# Patient Record
Sex: Female | Born: 1975 | Hispanic: No | Smoking: Former smoker
Health system: Southern US, Community
[De-identification: ages and names within clinical notes are randomized; demographics above are authoritative.]

## PROBLEM LIST (undated history)

## (undated) DIAGNOSIS — D259 Leiomyoma of uterus, unspecified: Secondary | ICD-10-CM

## (undated) DIAGNOSIS — Z789 Other specified health status: Secondary | ICD-10-CM

## (undated) DIAGNOSIS — O09529 Supervision of elderly multigravida, unspecified trimester: Secondary | ICD-10-CM

## (undated) DIAGNOSIS — D62 Acute posthemorrhagic anemia: Secondary | ICD-10-CM

## (undated) HISTORY — PX: EPIDURAL BLOCK INJECTION: SHX1516

## (undated) HISTORY — PX: EYELID LACERATION REPAIR: SHX1564

## (undated) HISTORY — DX: Supervision of elderly multigravida, unspecified trimester: O09.529

---

## 2009-07-01 ENCOUNTER — Encounter: Admission: RE | Admit: 2009-07-01 | Discharge: 2009-07-01 | Payer: Self-pay | Admitting: Specialist

## 2010-06-11 NOTE — L&D Delivery Note (Signed)
Operative Delivery Note At 2:33 AM a viable and healthy female was delivered via Vaginal, Vacuum Investment banker, operational).  Presentation: vertex; Position: Occiput,, Anterior; Station: +3.  Verbal consent: obtained from patient.  Risks and benefits discussed in detail.  Risks include, but are not limited to the risks of anesthesia, bleeding, infection, damage to maternal tissues, fetal cephalhematoma.  There is also the risk of inability to effect vaginal delivery of the head, or shoulder dystocia that cannot be resolved by established maneuvers, leading to the need for emergency cesarean section.  APGAR: 8, 9; weight 7 lb 13.4 oz (3555 g).   Placenta status: Intact, Spontaneous.  To Path for maternal fever  Cord: 3 vessels with the following complications: .  Cord pH: none  Anesthesia: Epidural  Instruments: mushroom vacuum Episiotomy: None Lacerations: 3rd degree;Perineal;Sulcus;Vaginal Suture Repair: 3.0 chromic vicryl O vicryl x 4 for sphincter repair Est. Blood Loss (mL): 350cc  Mom to postpartum.  Baby to nursery-stable.  Sharda Keddy A 04/07/2011, 3:30 AM

## 2010-08-17 LAB — HEPATITIS B SURFACE ANTIGEN: Hepatitis B Surface Ag: NEGATIVE

## 2010-08-17 LAB — RPR: RPR: NONREACTIVE

## 2010-08-17 LAB — ABO/RH: RH Type: POSITIVE

## 2010-08-17 LAB — ANTIBODY SCREEN: Antibody Screen: NEGATIVE

## 2011-02-16 ENCOUNTER — Encounter (HOSPITAL_COMMUNITY): Payer: Self-pay | Admitting: *Deleted

## 2011-02-16 ENCOUNTER — Observation Stay (HOSPITAL_COMMUNITY)
Admission: AD | Admit: 2011-02-16 | Discharge: 2011-02-16 | Disposition: A | Discharge status: Home / Self Care | Payer: BC Managed Care – PPO | Source: Ambulatory Visit | Attending: Obstetrics & Gynecology | Admitting: Obstetrics & Gynecology

## 2011-02-16 DIAGNOSIS — Y92410 Unspecified street and highway as the place of occurrence of the external cause: Secondary | ICD-10-CM | POA: Insufficient documentation

## 2011-02-16 DIAGNOSIS — O99891 Other specified diseases and conditions complicating pregnancy: Principal | ICD-10-CM | POA: Insufficient documentation

## 2011-02-16 MED ORDER — SODIUM CHLORIDE 0.9 % IV SOLN
INTRAVENOUS | Status: DC
  Administered 2011-02-16: 20:00:00 via INTRAVENOUS

## 2011-02-16 MED ORDER — SODIUM CHLORIDE 0.9 % IV SOLN
Freq: Once | INTRAVENOUS | Status: AC
  Administered 2011-02-16: 19:00:00 via INTRAVENOUS
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
## 2011-02-16 NOTE — Progress Notes (Signed)
DR. Billy Coast CALLED WITH UPDATE ON PT. REACTIVE STRIP RARE UC'S MILD UI NOTED. PT RATES HER PAIN 1 OUT OF 10. ABDOMINAL CRAMPING. NO NEW ORDERS AT THIS TIME. MD WILL SEE PT ON UNIT.

## 2011-04-04 ENCOUNTER — Encounter (HOSPITAL_COMMUNITY): Payer: Self-pay | Admitting: *Deleted

## 2011-04-04 ENCOUNTER — Telehealth (HOSPITAL_COMMUNITY): Payer: Self-pay | Admitting: *Deleted

## 2011-04-04 NOTE — Telephone Encounter (Signed)
Preadmission screen  

## 2011-04-06 ENCOUNTER — Encounter (HOSPITAL_COMMUNITY): Payer: Self-pay | Admitting: Anesthesiology

## 2011-04-06 ENCOUNTER — Encounter (HOSPITAL_COMMUNITY): Payer: Self-pay | Admitting: *Deleted

## 2011-04-06 ENCOUNTER — Inpatient Hospital Stay (HOSPITAL_COMMUNITY): Payer: BC Managed Care – PPO | Admitting: Anesthesiology

## 2011-04-06 ENCOUNTER — Inpatient Hospital Stay (HOSPITAL_COMMUNITY)
Admission: AD | Admit: 2011-04-06 | Discharge: 2011-04-09 | DRG: 372 | Disposition: A | Discharge status: Home / Self Care | Payer: BC Managed Care – PPO | Source: Ambulatory Visit | Attending: Obstetrics & Gynecology | Admitting: Obstetrics & Gynecology

## 2011-04-06 DIAGNOSIS — IMO0001 Reserved for inherently not codable concepts without codable children: Secondary | ICD-10-CM

## 2011-04-06 DIAGNOSIS — R338 Other retention of urine: Secondary | ICD-10-CM

## 2011-04-06 DIAGNOSIS — D62 Acute posthemorrhagic anemia: Secondary | ICD-10-CM | POA: Diagnosis not present

## 2011-04-06 DIAGNOSIS — O09519 Supervision of elderly primigravida, unspecified trimester: Secondary | ICD-10-CM | POA: Diagnosis present

## 2011-04-06 DIAGNOSIS — O9903 Anemia complicating the puerperium: Secondary | ICD-10-CM | POA: Diagnosis not present

## 2011-04-06 HISTORY — DX: Acute posthemorrhagic anemia: D62

## 2011-04-06 HISTORY — DX: Other specified health status: Z78.9

## 2011-04-06 LAB — CBC
Hemoglobin: 13.5 g/dL (ref 12.0–15.0)
MCH: 31 pg (ref 26.0–34.0)
MCHC: 33.3 g/dL (ref 30.0–36.0)
Platelets: 196 10*3/uL (ref 150–400)
RDW: 14.2 % (ref 11.5–15.5)

## 2011-04-06 LAB — RPR: RPR Ser Ql: NONREACTIVE

## 2011-04-06 MED ORDER — FENTANYL 2.5 MCG/ML BUPIVACAINE 1/10 % EPIDURAL INFUSION (WH - ANES)
INTRAMUSCULAR | Status: DC | PRN
  Administered 2011-04-06: 14 mL/h via EPIDURAL

## 2011-04-06 MED ORDER — LACTATED RINGERS IV BOLUS (SEPSIS)
1000.0000 mL | Freq: Once | INTRAVENOUS | Status: AC
  Administered 2011-04-06: 1000 mL via INTRAVENOUS

## 2011-04-06 MED ORDER — EPHEDRINE 5 MG/ML INJ
10.0000 mg | INTRAVENOUS | Status: DC | PRN
  Filled 2011-04-06: qty 4

## 2011-04-06 MED ORDER — TERBUTALINE SULFATE 1 MG/ML IJ SOLN: 0.2500 mg | Freq: Once | INTRAMUSCULAR | Status: AC | PRN

## 2011-04-06 MED ORDER — BUTORPHANOL TARTRATE 2 MG/ML IJ SOLN
1.0000 mg | INTRAMUSCULAR | Status: DC | PRN
  Administered 2011-04-06: 1 mg via INTRAVENOUS
  Filled 2011-04-06: qty 1

## 2011-04-06 MED ORDER — OXYTOCIN 20 UNITS IN LACTATED RINGERS INFUSION - SIMPLE
1.0000 m[IU]/min | INTRAVENOUS | Status: DC
  Administered 2011-04-06: 9 m[IU]/min via INTRAVENOUS
  Administered 2011-04-06: 8 m[IU]/min via INTRAVENOUS
  Administered 2011-04-06: 1 m[IU]/min via INTRAVENOUS
  Filled 2011-04-06: qty 1000

## 2011-04-06 MED ORDER — ACETAMINOPHEN 325 MG PO TABS
650.0000 mg | ORAL_TABLET | ORAL | Status: DC | PRN
  Administered 2011-04-07: 1000 mg via ORAL

## 2011-04-06 MED ORDER — OXYTOCIN 20 UNITS IN LACTATED RINGERS INFUSION - SIMPLE: 125.0000 mL/h | Freq: Once | INTRAVENOUS | Status: DC

## 2011-04-06 MED ORDER — OXYCODONE-ACETAMINOPHEN 5-325 MG PO TABS: 2.0000 | ORAL_TABLET | ORAL | Status: DC | PRN

## 2011-04-06 MED ORDER — DIPHENHYDRAMINE HCL 50 MG/ML IJ SOLN: 12.5000 mg | INTRAMUSCULAR | Status: DC | PRN

## 2011-04-06 MED ORDER — ONDANSETRON HCL 4 MG/2ML IJ SOLN: 4.0000 mg | Freq: Four times a day (QID) | INTRAMUSCULAR | Status: DC | PRN

## 2011-04-06 MED ORDER — FENTANYL 2.5 MCG/ML BUPIVACAINE 1/10 % EPIDURAL INFUSION (WH - ANES)
14.0000 mL/h | INTRAMUSCULAR | Status: DC
  Administered 2011-04-06: 14 mL/h via EPIDURAL
  Filled 2011-04-06 (×3): qty 60

## 2011-04-06 MED ORDER — LIDOCAINE HCL (PF) 1 % IJ SOLN
30.0000 mL | INTRAMUSCULAR | Status: DC | PRN
  Administered 2011-04-07: 30 mL via SUBCUTANEOUS
  Filled 2011-04-06: qty 30

## 2011-04-06 MED ORDER — PHENYLEPHRINE 40 MCG/ML (10ML) SYRINGE FOR IV PUSH (FOR BLOOD PRESSURE SUPPORT)
80.0000 ug | PREFILLED_SYRINGE | INTRAVENOUS | Status: DC | PRN
  Filled 2011-04-06: qty 5

## 2011-04-06 MED ORDER — IBUPROFEN 600 MG PO TABS: 600.0000 mg | ORAL_TABLET | Freq: Four times a day (QID) | ORAL | Status: DC | PRN

## 2011-04-06 MED ORDER — LACTATED RINGERS IV SOLN: 500.0000 mL | Freq: Once | INTRAVENOUS | Status: DC

## 2011-04-06 MED ORDER — FLEET ENEMA 7-19 GM/118ML RE ENEM: 1.0000 | ENEMA | RECTAL | Status: DC | PRN

## 2011-04-06 MED ORDER — EPHEDRINE 5 MG/ML INJ: 10.0000 mg | INTRAVENOUS | Status: DC | PRN

## 2011-04-06 MED ORDER — OXYTOCIN 20 UNITS IN LACTATED RINGERS INFUSION - SIMPLE: 1.0000 m[IU]/min | INTRAVENOUS | Status: DC

## 2011-04-06 MED ORDER — LIDOCAINE HCL 1.5 % IJ SOLN
INTRAMUSCULAR | Status: DC | PRN
  Administered 2011-04-06 (×2): 5 mL via EPIDURAL

## 2011-04-06 MED ORDER — OXYTOCIN BOLUS FROM INFUSION
500.0000 mL | Freq: Once | INTRAVENOUS | Status: DC
  Filled 2011-04-06: qty 1000
  Filled 2011-04-06: qty 500

## 2011-04-06 MED ORDER — LACTATED RINGERS IV SOLN
INTRAVENOUS | Status: DC
  Administered 2011-04-06: 06:00:00 via INTRAVENOUS
  Administered 2011-04-06: 125 mL/h via INTRAVENOUS
  Administered 2011-04-06: 11:00:00 via INTRAVENOUS

## 2011-04-06 MED ORDER — CITRIC ACID-SODIUM CITRATE 334-500 MG/5ML PO SOLN: 30.0000 mL | ORAL | Status: DC | PRN

## 2011-04-06 MED ORDER — PHENYLEPHRINE 40 MCG/ML (10ML) SYRINGE FOR IV PUSH (FOR BLOOD PRESSURE SUPPORT): 80.0000 ug | PREFILLED_SYRINGE | INTRAVENOUS | Status: DC | PRN

## 2011-04-06 MED ORDER — LACTATED RINGERS IV SOLN: 500.0000 mL | INTRAVENOUS | Status: DC | PRN

## 2011-04-06 NOTE — Anesthesia Preprocedure Evaluation (Signed)
Anesthesia Evaluation  Patient identified by MRN, date of birth, ID band Patient awake  General Assessment Comment  Reviewed: Allergy & Precautions, H&P , NPO status , Patient's Chart, lab work & pertinent test results  Airway Mallampati: I TM Distance: >3 FB Neck ROM: full    Dental No notable dental hx.    Pulmonary  clear to auscultation  Pulmonary exam normal       Cardiovascular     Neuro/Psych Negative Neurological ROS  Negative Psych ROS   GI/Hepatic negative GI ROS Neg liver ROS    Endo/Other  Negative Endocrine ROS  Renal/GU negative Renal ROS  Genitourinary negative   Musculoskeletal negative musculoskeletal ROS (+)   Abdominal Normal abdominal exam  (+)   Peds negative pediatric ROS (+)  Hematology negative hematology ROS (+)   Anesthesia Other Findings   Reproductive/Obstetrics (+) Pregnancy                           Anesthesia Physical Anesthesia Plan  ASA: II  Anesthesia Plan: Epidural   Post-op Pain Management:    Induction:   Airway Management Planned:   Additional Equipment:   Intra-op Plan:   Post-operative Plan:   Informed Consent: I have reviewed the patients History and Physical, chart, labs and discussed the procedure including the risks, benefits and alternatives for the proposed anesthesia with the patient or authorized representative who has indicated his/her understanding and acceptance.     Plan Discussed with:   Anesthesia Plan Comments:         Anesthesia Quick Evaluation  

## 2011-04-06 NOTE — Progress Notes (Signed)
Bellamarie Pflug is a 35 y.o. G1P0000 at [redacted]w[redacted]d by LMP admitted for active labor  Epidural.  Pitocin 10 MIU Subjective: Chief Complaint  Patient presents with  . Contractions    Objective: BP 118/73  Pulse 87  Temp(Src) 98.3 F (36.8 C) (Oral)  Resp 18  Ht 5\' 2"  (1.575 m)  Wt 127 lb (57.607 kg)  BMI 23.23 kg/m2  SpO2 97%  LMP 06/17/2010      FHT:  FHR: 120 bpm, variability: moderate,  accelerations:  Present,  decelerations:  Absent UC:   irregular, every 2-4 minutes SVE:   5/sl edematous/-2 ROP asynclytic Tracing: reactive  Labs: Lab Results  Component Value Date   WBC 8.1 04/06/2011   HGB 13.5 04/06/2011   HCT 40.5 04/06/2011   MCV 93.1 04/06/2011   PLT 196 04/06/2011    Assessment / Plan: Protracted active phase  P) IUPC placed. Increase pitocin. Exaggerated sims position Anticipated MOD:  NSVD  Kinsler Soeder A 04/06/2011, 6:57 PM

## 2011-04-06 NOTE — Anesthesia Procedure Notes (Signed)
Epidural Patient location during procedure: OB Start time: 04/06/2011 12:46 PM End time: 04/06/2011 12:51 PM Reason for block: procedure for pain  Staffing Anesthesiologist: Sandrea Hughs Performed by: anesthesiologist   Preanesthetic Checklist Completed: patient identified, site marked, surgical consent, pre-op evaluation, timeout performed, IV checked, risks and benefits discussed and monitors and equipment checked  Epidural Patient position: sitting Prep: site prepped and draped and DuraPrep Patient monitoring: continuous pulse ox and blood pressure Approach: midline Injection technique: LOR air  Needle:  Needle type: Tuohy  Needle gauge: 17 G Needle length: 9 cm Needle insertion depth: 4 cm Catheter type: closed end flexible Catheter size: 19 Gauge Catheter at skin depth: 9 cm Test dose: negative and 1.5% lidocaine  Assessment Sensory level: T10 Events: blood not aspirated, injection not painful, no injection resistance, negative IV test and no paresthesia

## 2011-04-06 NOTE — H&P (Signed)
[name] is a 35 y.o. female presenting for active labor at 40.5/7 wks. G1P0, uncomplicated pregnancy, PNC Dr Ernestina Penna at [facility]. No Leaking at admission, min bloody show, contrxns every 5-6 min, painful. Since admission received stadol for pain relief.   History OB History    Grav Para Term Preterm Abortions TAB SAB Ect Mult Living   1 0 0 0 0 0 0 0 0 0      Past Medical History  Diagnosis Date  . AMA (advanced maternal age) multigravida 35+   . No pertinent past medical history    Past Surgical History  Procedure Date  . Eyelid laceration repair 20-25 yrs ago     in office- "put to sleep" no problems   Family History: family history includes Hypertension in her mother. Social History:  reports that she has never smoked. She has never used smokeless tobacco. She reports that she does not drink alcohol or use illicit drugs.  ROS  Exam Physical Exam  Blood pressure 119/83, pulse 67, temperature 97.6 F (36.4 C), temperature source Oral, resp. rate 18, height 5\' 2"  (1.575 m), weight 57.607 kg (127 lb), last menstrual period 06/17/2010, SpO2 98.00%. Physical exam:  A&O x 3, no acute distress. Pleasant HEENT neg, no thyromegaly Lungs CTA bilat CV RRR, S1S2 normal Abdo soft, non tender, non acute Extr no edema/ tenderness Pelvic see below FHT  140s/ + accels/ no decels/ moderate variability/ Category I Toco q 6-7 min.  Dilation: 2 Effacement (%): 80 Station: -1 Exam by:: l.poore ,RN (at admission)  Repeat exam by [facility] - 2/100%/-1 to 0/ AROM, minimal fluid.   Prenatal labs: ABO, Rh: A/Positive/-- (03/08 0000) Antibody: Negative (03/08 0000) Rubella: Immune (03/08 0000) RPR: Nonreactive (03/08 0000)  HBsAg: Negative (03/08 0000)  HIV: Non-reactive (03/08 0000)  GBS: Negative (10/05 0000)  1 hr Glucola 105. Ultrascreen neg, AFP 1 neg. Ob Anatomy sono - nl  Assessment/Plan: G1P0 at 40.5/7 wks, healthy gravida with uncomplicated pregnancy except AMA, normal  screen and sono. GBS negative. EFW 7 lbs. Pitocin augmentation. Stadol ok, epidural ok in active labor after 3-4 cm.  FWB- categoty I.    Monta Police R 04/06/2011, 9:22 AM

## 2011-04-06 NOTE — Progress Notes (Signed)
Spoke with Dr. Sherron Ales about pt being unable to feel pressure in perineum after pushing x 1 hour & being +3 station, even after epidural titrated down to 68ml/hr.  He recommends stopping epidural for 15 minutes & restarting at 0000 at 45ml/hr.

## 2011-04-06 NOTE — Progress Notes (Signed)
Ayvah Caroll is a 35 y.o. G1P0000 at [redacted]w[redacted]d by LMP admitted for active labor  Subjective: Chief Complaint  Patient presents with  . Contractions    Objective: BP 111/76  Pulse 107  Temp(Src) 98.7 F (37.1 C) (Oral)  Resp 16  Ht 5\' 2"  (1.575 m)  Wt 127 lb (57.607 kg)  BMI 23.23 kg/m2  SpO2 97%  LMP 06/17/2010      FHT:  FHR: 145 bpm, variability: moderate,  accelerations:  Present,  decelerations:  Present early UC:   Ctx q 2-3 mins SVE:   Fully (+) 2 station Tracing: (+) early decels  Labs: Lab Results  Component Value Date   WBC 8.1 04/06/2011   HGB 13.5 04/06/2011   HCT 40.5 04/06/2011   MCV 93.1 04/06/2011   PLT 196 04/06/2011    Assessment / Plan: Complete P) start pushing  Anticipated MOD:  NSVD  Selia Wareing A 04/06/2011, 11:29 PM

## 2011-04-07 ENCOUNTER — Other Ambulatory Visit: Payer: Self-pay | Admitting: Obstetrics and Gynecology

## 2011-04-07 ENCOUNTER — Encounter (HOSPITAL_COMMUNITY): Payer: Self-pay | Admitting: *Deleted

## 2011-04-07 MED ORDER — ONDANSETRON HCL 4 MG PO TABS: 4.0000 mg | ORAL_TABLET | ORAL | Status: DC | PRN

## 2011-04-07 MED ORDER — BENZOCAINE-MENTHOL 20-0.5 % EX AERO
1.0000 "application " | INHALATION_SPRAY | CUTANEOUS | Status: DC | PRN
  Administered 2011-04-07 – 2011-04-08 (×2): 1 via TOPICAL

## 2011-04-07 MED ORDER — WITCH HAZEL-GLYCERIN EX PADS: 1.0000 "application " | MEDICATED_PAD | CUTANEOUS | Status: DC | PRN

## 2011-04-07 MED ORDER — OXYCODONE-ACETAMINOPHEN 5-325 MG PO TABS
1.0000 | ORAL_TABLET | ORAL | Status: DC | PRN
  Administered 2011-04-08: 1 via ORAL
  Filled 2011-04-07: qty 1

## 2011-04-07 MED ORDER — ONDANSETRON HCL 4 MG/2ML IJ SOLN: 4.0000 mg | INTRAMUSCULAR | Status: DC | PRN

## 2011-04-07 MED ORDER — SIMETHICONE 80 MG PO CHEW: 80.0000 mg | CHEWABLE_TABLET | ORAL | Status: DC | PRN

## 2011-04-07 MED ORDER — PRENATAL PLUS 27-1 MG PO TABS
1.0000 | ORAL_TABLET | Freq: Every day | ORAL | Status: DC
  Administered 2011-04-07 – 2011-04-08 (×2): 1 via ORAL
  Filled 2011-04-07 (×2): qty 1

## 2011-04-07 MED ORDER — SENNOSIDES-DOCUSATE SODIUM 8.6-50 MG PO TABS
2.0000 | ORAL_TABLET | Freq: Every day | ORAL | Status: DC
  Administered 2011-04-07: 2 via ORAL

## 2011-04-07 MED ORDER — OXYTOCIN 10 UNIT/ML IJ SOLN
INTRAMUSCULAR | Status: AC
  Filled 2011-04-07: qty 2

## 2011-04-07 MED ORDER — DIPHENHYDRAMINE HCL 25 MG PO CAPS: 25.0000 mg | ORAL_CAPSULE | Freq: Four times a day (QID) | ORAL | Status: DC | PRN

## 2011-04-07 MED ORDER — DOCUSATE SODIUM 100 MG PO CAPS
100.0000 mg | ORAL_CAPSULE | Freq: Two times a day (BID) | ORAL | Status: DC
  Administered 2011-04-07 – 2011-04-08 (×2): 100 mg via ORAL
  Filled 2011-04-07 (×2): qty 1

## 2011-04-07 MED ORDER — TETANUS-DIPHTH-ACELL PERTUSSIS 5-2.5-18.5 LF-MCG/0.5 IM SUSP
0.5000 mL | Freq: Once | INTRAMUSCULAR | Status: AC
  Administered 2011-04-08: 0.5 mL via INTRAMUSCULAR
  Filled 2011-04-07: qty 0.5

## 2011-04-07 MED ORDER — IBUPROFEN 600 MG PO TABS
600.0000 mg | ORAL_TABLET | Freq: Four times a day (QID) | ORAL | Status: DC
  Administered 2011-04-07 – 2011-04-09 (×9): 600 mg via ORAL
  Filled 2011-04-07 (×8): qty 1

## 2011-04-07 MED ORDER — ZOLPIDEM TARTRATE 5 MG PO TABS: 5.0000 mg | ORAL_TABLET | Freq: Every evening | ORAL | Status: DC | PRN

## 2011-04-07 MED ORDER — LANOLIN HYDROUS EX OINT: TOPICAL_OINTMENT | CUTANEOUS | Status: DC | PRN

## 2011-04-07 MED ORDER — OXYTOCIN 20 UNITS IN LACTATED RINGERS INFUSION - SIMPLE: 20.0000 [IU] | INTRAVENOUS | Status: DC

## 2011-04-07 MED ORDER — DIBUCAINE 1 % RE OINT: 1.0000 "application " | TOPICAL_OINTMENT | RECTAL | Status: DC | PRN

## 2011-04-07 MED ORDER — BENZOCAINE-MENTHOL 20-0.5 % EX AERO
INHALATION_SPRAY | CUTANEOUS | Status: AC
  Administered 2011-04-07: 1 via TOPICAL
  Filled 2011-04-07: qty 56

## 2011-04-07 NOTE — Anesthesia Postprocedure Evaluation (Signed)
  Anesthesia Post-op Note  Patient: [name]  Procedure(s) Performed: * No procedures listed *  Patient Location: Mother/Baby  Anesthesia Type: Epidural  Level of Consciousness: awake, alert  and oriented  Airway and Oxygen Therapy: Patient Spontanous Breathing  Post-op Pain: mild  Post-op Assessment: Patient's Cardiovascular Status Stable and Respiratory Function Stable  Post-op Vital Signs: stable  Complications: No apparent anesthesia complications

## 2011-04-07 NOTE — Progress Notes (Signed)
  INTERVAL NOTE:  S:  Sitting in bed eating breakfast, breastfed x 1, working on latch, min cramping, (+) voids, small bleed, denies HA/NV, mild dizziness with initial ambulation, no syncope.   O:  VSS, AAO x 3, NAD  FF bellow U  Small lochia  A / P:   PPD #0, VAVD  Stable post partum  Routine PP orders  Lactation support  Plan circumcision   Arlan Organ CNM, MSN  04/07/2011 10:32 AM

## 2011-04-07 NOTE — Progress Notes (Signed)
S: Pushing x 2 1/2 hrs. Nurse notes marked caput. Pt notes some rectal pressure  O:  temp 101.2  Ve: fully /+3 station w/ caput  Tracing: baseline 150 (+) early decels, ctx q 2 mins  Imp: Complete P) disc vacuum assistance due to maternal temp and prolonged pushing. Procedure explained. Risk and benefit disc.  All ? Answered including shape of head. Will proceed

## 2011-04-07 NOTE — Progress Notes (Signed)
S: pushing  X 1 hr without urge noted.   O: T99.8 (ax) VE: deferred Tracing reviewed: baseline 150 (+) early decel ctx q 2 mins  A/P: complete P) rest x 1 /2 hr. Epidural off. Labor vtx down. Watch temp. Tylenol 1000 mg po x 1 now. Encourage oral fluid intake

## 2011-04-08 ENCOUNTER — Encounter (HOSPITAL_COMMUNITY): Payer: Self-pay

## 2011-04-08 DIAGNOSIS — R338 Other retention of urine: Secondary | ICD-10-CM

## 2011-04-08 DIAGNOSIS — D62 Acute posthemorrhagic anemia: Secondary | ICD-10-CM

## 2011-04-08 HISTORY — DX: Acute posthemorrhagic anemia: D62

## 2011-04-08 LAB — CBC
HCT: 21.6 % — ABNORMAL LOW (ref 36.0–46.0)
HCT: 22.8 % — ABNORMAL LOW (ref 36.0–46.0)
Hemoglobin: 7.3 g/dL — ABNORMAL LOW (ref 12.0–15.0)
Hemoglobin: 7.7 g/dL — ABNORMAL LOW (ref 12.0–15.0)
MCH: 31.6 pg (ref 26.0–34.0)
MCHC: 33.8 g/dL (ref 30.0–36.0)
MCV: 92.3 fL (ref 78.0–100.0)
MCV: 93.4 fL (ref 78.0–100.0)
RBC: 2.34 MIL/uL — ABNORMAL LOW (ref 3.87–5.11)
RBC: 2.44 MIL/uL — ABNORMAL LOW (ref 3.87–5.11)
RDW: 14.5 % (ref 11.5–15.5)
WBC: 16.9 10*3/uL — ABNORMAL HIGH (ref 4.0–10.5)

## 2011-04-08 MED ORDER — BENZOCAINE-MENTHOL 20-0.5 % EX AERO
INHALATION_SPRAY | CUTANEOUS | Status: AC
  Administered 2011-04-08: 1 via TOPICAL
  Filled 2011-04-08: qty 56

## 2011-04-08 MED ORDER — POLYSACCHARIDE IRON 150 MG PO CAPS
150.0000 mg | ORAL_CAPSULE | Freq: Two times a day (BID) | ORAL | Status: DC
  Administered 2011-04-08: 150 mg via ORAL
  Filled 2011-04-08 (×3): qty 1

## 2011-04-08 NOTE — Progress Notes (Signed)
  PPD 1VAVD  S:  Reports feeling better today, was tired and dizzy yesterday morning             Tolerating po/ No nausea or vomiting             Bleeding is light             Increased pain with ambulation, taking Motrin with moderate relief             Up ad lib / ambulatory, denies dizziness / SOB / syncope   Urinary retention yesterday, foley cath in place since last night, states afraid to void with increased perineal pain   Newborn  Information for the patient's newborn:  Arlynn, Stare [[id number]]  female   breast feeding  / Circumcision done   O:  A & O x 3 NAD but pale, fatigued appearing             Temp:  [97.9 F (36.6 C)-98.5 F (36.9 C)] 98.3 F (36.8 C) (10/28 0634) Pulse Rate:  [90-114] 90  (10/28 0634) Resp:  [20] 20  (10/28 0634) BP: (89-100)/(57-65) 89/57 mmHg (10/28 0634)   LABS:   Basename 04/08/11 0515 04/06/11 0612  HGB 7.3* 13.5  HCT 21.6* 40.5    I&O: I/O last 3 completed shifts: In: 380 [P.O.:380] Out: 2400 [Urine:2400]      Lungs: Clear and unlabored  Heart: regular rate and rhythm / no mumurs  Abdomen: soft, non-tender, non-distended              Fundus: firm, non-tender, U-2  Perineum: moderate edema, 3rd deg perineal repair intact  Lochia: scant  Extremities: no edema, no calf pain or tenderness, neg Homans    A/P: PPD # 1 35 y.o., G1P1001 S/P:vacuum extraction   Principal Problem:  *Postpartum care following vaginal delivery (10/27) Active Problems:  Acute blood loss anemia  - orthostatic VS stable, diuresing well. No evidence of hemorrhage. Significant hgb drop (5 points) but minimal clinical symptoms at present. Will repeat CBC in PM, strict I&O next 24 hrs. Consider blood transfusion if further hgb drop.  Start oral Fe and stool softeners.   Urinary retention -bladder rest overnight, will D/C foley this am, advised frequent voids Q 2-3 hours while awake, Percocet for perineal pain to enable relaxation -sitz bath for  comfort.  Dr. Cherly Hensen updated  Arlan Organ, CNM, MSN 04/08/2011, 9:42 AM

## 2011-04-08 NOTE — Progress Notes (Signed)
Patient assessed and fundus deviated to left. Patient up several times to urinate and unable. Bladder scan done with 838 possible ml in bladder. 14 Fr Foley Catheter inserted without difficulty with return of 1100 ml of clear yellow urine. Colon Flattery CNM called at 04-07-11, 2309 Orders received to keep catheter in place until AM. Fundus at U-1 and firm. [name]

## 2011-04-09 LAB — URINE CULTURE
Colony Count: NO GROWTH
Culture: NO GROWTH

## 2011-04-09 MED ORDER — NITROFURANTOIN MACROCRYSTAL 100 MG PO CAPS: 100.0000 mg | ORAL_CAPSULE | Freq: Two times a day (BID) | ORAL | Status: AC

## 2011-04-09 MED ORDER — DSS 100 MG PO CAPS: 100.0000 mg | ORAL_CAPSULE | Freq: Two times a day (BID) | ORAL | Status: AC

## 2011-04-09 MED ORDER — IBUPROFEN 600 MG PO TABS: 600.0000 mg | ORAL_TABLET | Freq: Four times a day (QID) | ORAL | Status: AC

## 2011-04-09 MED ORDER — POLYSACCHARIDE IRON 150 MG PO CAPS: 150.0000 mg | ORAL_CAPSULE | Freq: Every day | ORAL | Status: DC

## 2011-04-09 MED ORDER — OXYCODONE-ACETAMINOPHEN 5-325 MG PO TABS: 1.0000 | ORAL_TABLET | Freq: Four times a day (QID) | ORAL | Status: AC | PRN

## 2011-04-09 NOTE — Progress Notes (Signed)
Patient several unsuccessful attempts to void. Bladder scan done with 738 possible ml. Dr Juliene Pina called and given report. Orders given to insert foley catheter. Foley inserted at 0115 with return of 600 ml clear yellow urine. Patient fundus remains firm with small lochia. Jackalyn Lombard

## 2011-04-09 NOTE — Progress Notes (Signed)
Leg bag applied. Instructed on care of leg bag and foley care. Verbalized understanding.

## 2011-04-09 NOTE — Discharge Summary (Signed)
Obstetric Discharge Summary Reason for Admission: onset of labor Prenatal Procedures: NST and ultrasound Intrapartum Procedures: VAVD with sulcus laceration and 3rd degree Postpartum Procedures: foley catheter placement x 2 / foley leg bag for discharge Complications-Operative and Postpartum: 3 degree perineal laceration and urinary retention and acute blood loss anemia  Hemoglobin  Date Value Range Status  04/08/2011 7.7* 12.0-15.0 (g/dL) Final     HCT  Date Value Range Status  04/08/2011 22.8* 36.0-46.0 (%) Final    Discharge Diagnoses: Term Pregnancy-delivered and 3rd degree laceration and acute blood loss anemia - stable and urinary retention  Discharge Information: Date: 04/09/2011 Activity: pelvic rest Diet: routine Medications: PNV, Ibuprofen, Colace, Iron, Percocet and macrobid Condition: stable Instructions: refer to practice specific booklet                        Return to [facility] for urinary catheter removal on Wednesday this week                        Strict bladder training instructions for voiding every 3 hours prior to urge to void  Discharge to: home  Follow-up Information    Follow up with [facility] Make an appointment in 3 days.   Contact information:   [address] [place] [address] [phone]          Newborn Data: Live born female  Birth Weight: 7 lb 13.4 oz (3555 g) APGAR: 8, 9  Home with mother.  [name] 04/09/2011, 8:36 AM

## 2011-04-09 NOTE — Progress Notes (Signed)
  PPD 2 VAVD with 3rd degree LAC / Urinary retention  S:  Reports feeling ok             Tolerating po/ No nausea or vomiting             Bleeding is light             Pain controlled with Ibuprofen and Percocet             Up ad lib / ambulatory  / unable to void and re-catherized (#3) with foley in place this am  Newborn breast feeding  / Female   O:  A & O x 3              VS: Blood pressure 96/62, pulse 93, temperature 98.2 F (36.8 C), temperature source Oral, resp. rate 20, height 5\' 2"  (1.575 m), weight 57.607 kg (127 lb), last menstrual period 06/17/2010, SpO2 98.00%, unknown if currently breastfeeding.  LABS: Lab Results  Component Value Date   WBC 16.4* 04/08/2011   HGB 7.7* 04/08/2011   HCT 22.8* 04/08/2011   MCV 93.4 04/08/2011   PLT 195 04/08/2011     I&O: I/O last 3 completed shifts: In: 1680 [P.O.:1680] Out: 4380 [Urine:4380] - foley in place      Lungs: Clear and unlabored  Heart: regular rate and rhythm / no mumurs  Abdomen: soft, non-tender, non-distended              Fundus: firm, non-tender, U-1  Perineum: + edema / ice pack in place / foley draining clear urine  Lochia: light  Extremities: no edema, no calf pain or tenderness  Postpartum visit with Dr Ernestina Penna this am - plan to d/c today with foley in place until Wednesday Return to office on Wed for removal of foley - strict bladder instructions with removal (void q 3 hours) Macrobid x 3 day course ( repetitive catheterization / 3rd degree / foley > 24 hour use)    A: PPD # 2 VAVD with 3rd degree LAC             Urinary retention   Acute blood loss anemia - stable status  P:  Routine post partum orders with stool softeners x 6 weeks             Foley x 3 days - removal at [facility] Wednesday - call for apt with nurse             Macrobid Rx x 3 days - prophylaxis             Increase water hydration / iron supplement x 6 weeks    Wakeelah Solan 04/09/2011, 8:06 AM

## 2011-04-11 ENCOUNTER — Inpatient Hospital Stay (HOSPITAL_COMMUNITY): Admission: RE | Admit: 2011-04-11 | Payer: BC Managed Care – PPO | Source: Ambulatory Visit

## 2011-06-02 IMAGING — CR DG CHEST 1V
1 series · 1 of 1 positions shown · non-contrast
Comparison: None

CLINICAL DATA: Positive PPD, immigration chest x-ray

CHEST - 1 VIEW

[view not recorded]
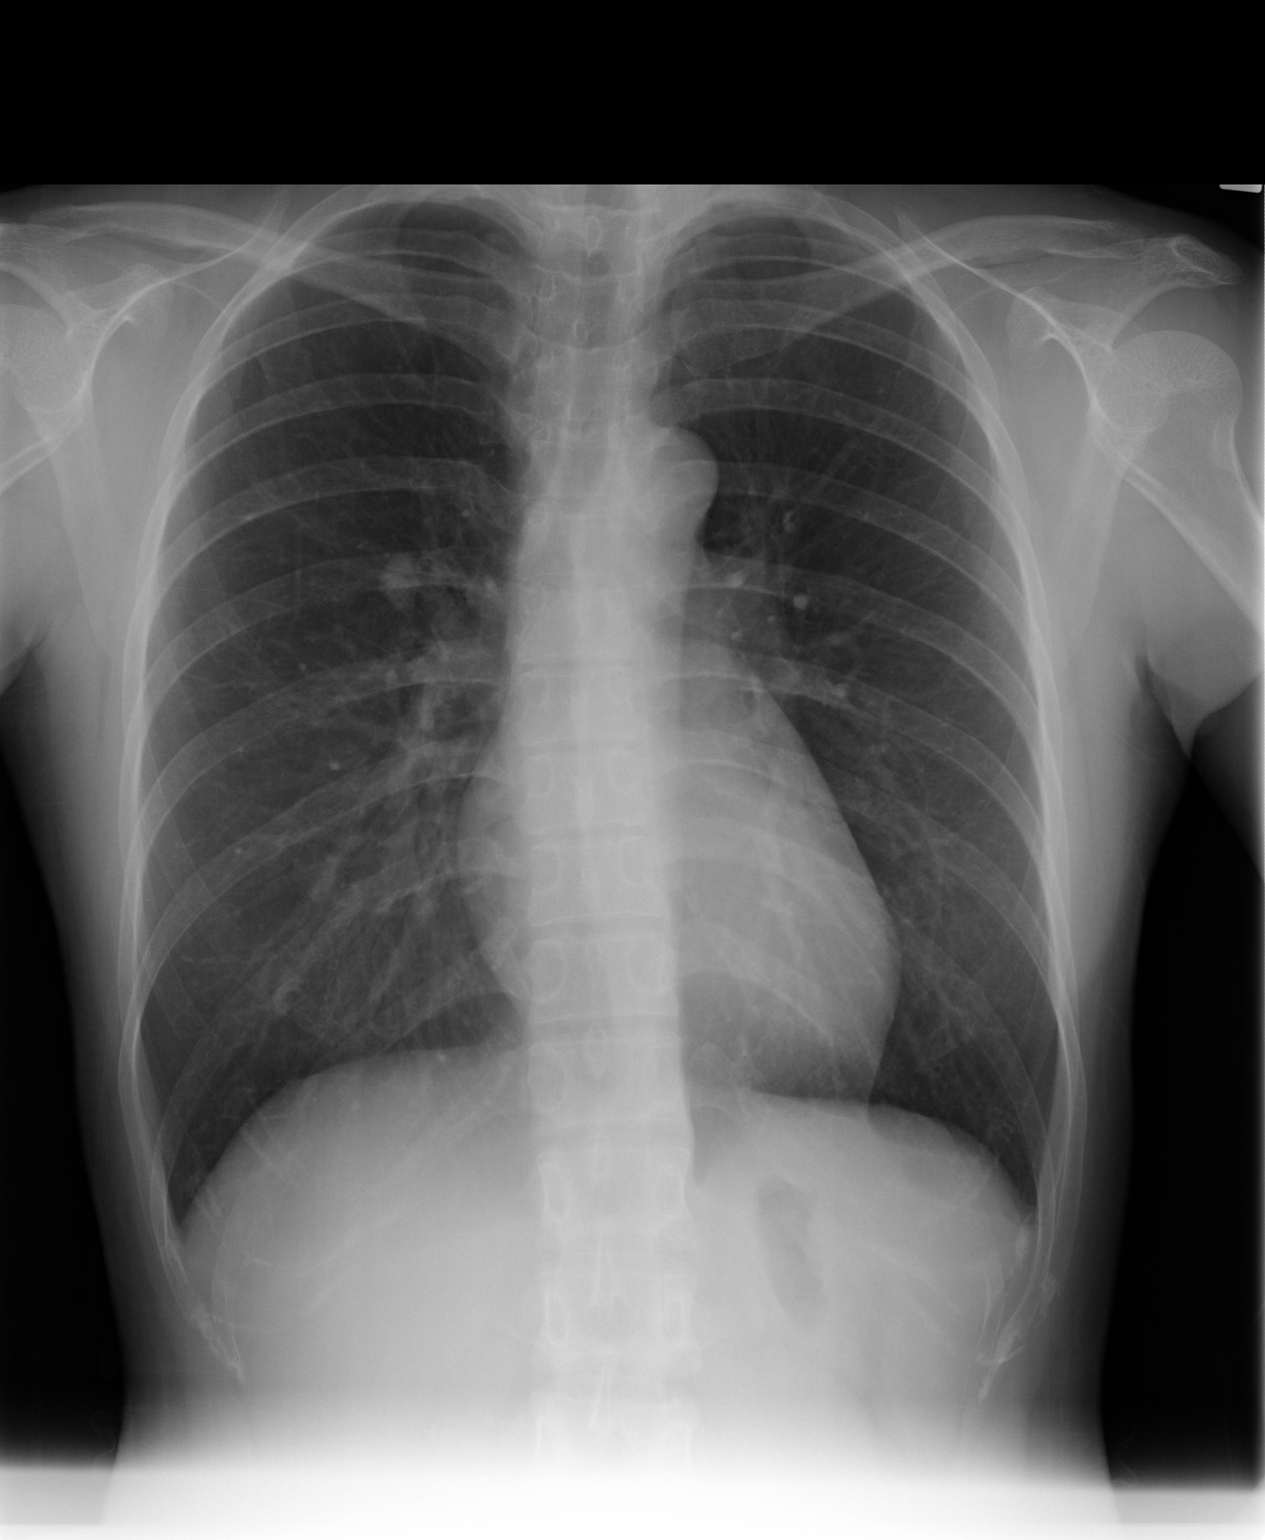

[1 of 1 positions shown; findings below may reference images not displayed]

FINDINGS: No active infiltrate or effusion is seen.  However, there
is a nodular opacity in the right perihilar region of approximately
15 x 10 mm.  Although this could represent tortuous pulmonary
vascularity, a lung nodule cannot be excluded.  Comparison with
prior or follow-up chest x-ray versus CT of the chest is
recommended.  No calcification of this nodule is seen and no
calcified hilar or mediastinal nodes are seen on the single frontal
projection obtained.  The heart is within normal limits in size.
No bony abnormality is seen.
IMPRESSION: Nodular lesion in right perihilar region may represent a lung
nodule versus pulmonary vascularity.  Recommend comparison with
prior or follow-up chest x-ray versus CT of the chest with IV
contrast .

## 2014-04-12 ENCOUNTER — Encounter (HOSPITAL_COMMUNITY): Payer: Self-pay

## 2020-01-26 ENCOUNTER — Other Ambulatory Visit: Payer: Self-pay | Admitting: Obstetrics

## 2020-01-27 ENCOUNTER — Other Ambulatory Visit: Payer: Self-pay | Admitting: Obstetrics

## 2020-02-19 ENCOUNTER — Other Ambulatory Visit: Payer: Self-pay

## 2020-02-19 ENCOUNTER — Ambulatory Visit (HOSPITAL_COMMUNITY)
Admission: RE | Admit: 2020-02-19 | Discharge: 2020-02-19 | Disposition: A | Discharge status: Home / Self Care | Payer: BC Managed Care – PPO | Source: Ambulatory Visit | Attending: Obstetrics | Admitting: Obstetrics

## 2020-02-19 DIAGNOSIS — D251 Intramural leiomyoma of uterus: Secondary | ICD-10-CM | POA: Diagnosis not present

## 2020-02-19 DIAGNOSIS — D509 Iron deficiency anemia, unspecified: Secondary | ICD-10-CM | POA: Diagnosis present

## 2020-02-19 DIAGNOSIS — N920 Excessive and frequent menstruation with regular cycle: Secondary | ICD-10-CM | POA: Diagnosis not present

## 2020-02-19 MED ORDER — SODIUM CHLORIDE 0.9 % IV SOLN
510.0000 mg | Freq: Once | INTRAVENOUS | Status: AC
  Administered 2020-02-19: 510 mg via INTRAVENOUS
  Filled 2020-02-19: qty 17

## 2020-02-19 NOTE — Discharge Instructions (Signed)

## 2020-02-26 ENCOUNTER — Other Ambulatory Visit: Payer: Self-pay

## 2020-02-26 ENCOUNTER — Encounter (HOSPITAL_COMMUNITY)
Admission: RE | Admit: 2020-02-26 | Discharge: 2020-02-26 | Disposition: A | Discharge status: Home / Self Care | Payer: BC Managed Care – PPO | Source: Ambulatory Visit | Attending: Obstetrics | Admitting: Obstetrics

## 2020-02-26 DIAGNOSIS — D509 Iron deficiency anemia, unspecified: Secondary | ICD-10-CM | POA: Diagnosis not present

## 2020-02-26 MED ORDER — SODIUM CHLORIDE 0.9 % IV SOLN
510.0000 mg | Freq: Once | INTRAVENOUS | Status: AC
  Administered 2020-02-26: 510 mg via INTRAVENOUS
  Filled 2020-02-26: qty 17

## 2020-06-28 ENCOUNTER — Other Ambulatory Visit (HOSPITAL_COMMUNITY): Payer: BC Managed Care – PPO

## 2020-07-25 ENCOUNTER — Other Ambulatory Visit: Payer: Self-pay | Admitting: Obstetrics

## 2020-07-26 ENCOUNTER — Encounter (HOSPITAL_BASED_OUTPATIENT_CLINIC_OR_DEPARTMENT_OTHER): Payer: Self-pay | Admitting: Obstetrics

## 2020-07-26 ENCOUNTER — Inpatient Hospital Stay (HOSPITAL_COMMUNITY): Admission: RE | Admit: 2020-07-26 | Payer: BC Managed Care – PPO | Source: Ambulatory Visit

## 2020-07-26 ENCOUNTER — Other Ambulatory Visit: Payer: Self-pay

## 2020-07-26 NOTE — Progress Notes (Signed)
Spoke w/ via phone for pre-op interview---pt Lab needs dos----   Cbc  T & S            Lab results------lab appt 08-01-2020 940 am for cbc t 7&s COVID test ------08-01-2020 1100 Arrive at -------530 am 08-05-2020 NPO after MN NO Solid Food.  Clear liquids from MN until---430 am then npo Medications to take morning of surgery -----none Diabetic medication -----n/a Patient Special Instructions -----none Pre-Op special Istructions -----none Patient verbalized understanding of instructions that were given at this phone interview. Patient denies shortness of breath, chest pain, fever, cough at this phone interview.

## 2020-07-26 NOTE — Progress Notes (Signed)
YOU ARE SCHEDULED FOR A COVID TEST 08-01-2020 @ 1100 AM. THIS TEST MUST BE DONE BEFORE SURGERY. GO TO  [facility] [place], [place], IT IS APPROXIMATELY 2 MINUTES PAST [employer] ON THE RIGHT AND REMAIN IN YOUR CAR, THIS IS A DRIVE UP TEST. ONCE YOUR COVID TEST IS DONE PLEASE FOLLOW ALL THE QUARANTINE  INSTRUCTIONS GIVEN IN YOUR HANDOUT.      Your procedure is scheduled on 08-05-2020  Report to [facility]   Call this number if you have problems the morning of surgery  [phone].   OUR ADDRESS IS [address].  WE ARE LOCATED IN [facility].  PLEASE BRING YOUR INSURANCE CARD AND PHOTO ID DAY OF SURGERY.  ONLY ONE PERSON ALLOWED IN FACILITY WAITING AREA.                                     REMEMBER:  DO NOT EAT FOOD, CANDY GUM OR MINTS  AFTER MIDNIGHT . YOU MAY HAVE CLEAR LIQUIDS FROM MIDNIGHT UNTIL 430 AM. NO CLEAR LIQUIDS AFTER 430 AM DAY OF SURGERY.   YOU MAY  BRUSH YOUR TEETH MORNING OF SURGERY AND RINSE YOUR MOUTH OUT, NO CHEWING GUM CANDY OR MINTS.    CLEAR LIQUID DIET   Foods Allowed                                                                     Foods Excluded  Coffee and tea, regular and decaf                             liquids that you cannot  Plain Jell-O any favor except red or purple                                           see through such as: Fruit ices (not with fruit pulp)                                     milk, soups, orange juice  Iced Popsicles                                    All solid food Carbonated beverages, regular and diet                                    Cranberry, grape and apple juices Sports drinks like Gatorade Lightly seasoned clear broth or consume(fat free) Sugar, honey syrup  Sample Menu Breakfast                                Lunch  Supper Cranberry juice                    Beef broth                            Chicken  broth Jell-O                                     Grape juice                           Apple juice Coffee or tea                        Jell-O                                      Popsicle                                                Coffee or tea                        Coffee or tea  _____________________________________________________________________     TAKE THESE MEDICATIONS MORNING OF SURGERY WITH A SIP OF WATER: NONE.  ONE VISITOR IS ALLOWED IN WAITING ROOM ONLY DAY OF SURGERY.  NO VISITOR MAY SPEND THE NIGHT.  VISITOR ARE ALLOWED TO STAY UNTIL 800 PM.                                    DO NOT WEAR JEWERLY, MAKE UP, OR NAIL POLISH ON FINGERNAILS. DO NOT WEAR LOTIONS, POWDERS, PERFUMES OR DEODORANT. DO NOT SHAVE FOR 24 HOURS PRIOR TO DAY OF SURGERY. MEN MAY SHAVE FACE AND NECK. CONTACTS, GLASSES, OR DENTURES MAY NOT BE WORN TO SURGERY.                                    [employer] IS NOT RESPONSIBLE  FOR ANY BELONGINGS.                                                                    Marland Kitchen           [facility] - Preparing for Surgery Before surgery, you can play an important role.  Because skin is not sterile, your skin needs to be as free of germs as possible.  You can reduce the number of germs on your skin by washing with CHG (chlorahexidine gluconate) soap before surgery.  CHG is an antiseptic cleaner which kills germs and bonds with the skin to continue killing germs even after washing. Please DO NOT use if you have an allergy to CHG or antibacterial soaps.  If your  skin becomes reddened/irritated stop using the CHG and inform your nurse when you arrive at Short Stay. Do not shave (including legs and underarms) for at least 48 hours prior to the first CHG shower.  You may shave your face/neck. Please follow these instructions carefully:  1.  Shower with CHG Soap the night before surgery and the  morning of Surgery.  2.  If you choose to wash your hair, wash your hair first as  usual with your  normal  shampoo.  3.  After you shampoo, rinse your hair and body thoroughly to remove the  shampoo.                            4.  Use CHG as you would any other liquid soap.  You can apply chg directly  to the skin and wash                      Gently with a scrungie or clean washcloth.  5.  Apply the CHG Soap to your body ONLY FROM THE NECK DOWN.   Do not use on face/ open                           Wound or open sores. Avoid contact with eyes, ears mouth and genitals (private parts).                       Wash face,  Genitals (private parts) with your normal soap.             6.  Wash thoroughly, paying special attention to the area where your surgery  will be performed.  7.  Thoroughly rinse your body with warm water from the neck down.  8.  DO NOT shower/wash with your normal soap after using and rinsing off  the CHG Soap.                9.  Pat yourself dry with a clean towel.            10.  Wear clean pajamas.            11.  Place clean sheets on your bed the night of your first shower and do not  sleep with pets. Day of Surgery : Do not apply any lotions/deodorants the morning of surgery.  Please wear clean clothes to the hospital/surgery center.  FAILURE TO FOLLOW THESE INSTRUCTIONS MAY RESULT IN THE CANCELLATION OF YOUR SURGERY PATIENT SIGNATURE_________________________________  NURSE SIGNATURE__________________________________  ________________________________________________________________________                                                        QUESTIONS [name] PRE OP NURSE PHONE [phone]

## 2020-07-29 ENCOUNTER — Other Ambulatory Visit (HOSPITAL_COMMUNITY): Payer: BC Managed Care – PPO

## 2020-08-02 ENCOUNTER — Other Ambulatory Visit: Payer: Self-pay

## 2020-08-02 ENCOUNTER — Encounter (HOSPITAL_COMMUNITY)
Admission: RE | Admit: 2020-08-02 | Discharge: 2020-08-02 | Disposition: A | Discharge status: Home / Self Care | Payer: BC Managed Care – PPO | Source: Ambulatory Visit | Attending: Obstetrics | Admitting: Obstetrics

## 2020-08-02 ENCOUNTER — Other Ambulatory Visit (HOSPITAL_COMMUNITY)
Admission: RE | Admit: 2020-08-02 | Discharge: 2020-08-02 | Disposition: A | Discharge status: Home / Self Care | Payer: BC Managed Care – PPO | Source: Ambulatory Visit | Attending: Obstetrics | Admitting: Obstetrics

## 2020-08-02 DIAGNOSIS — Z20822 Contact with and (suspected) exposure to covid-19: Secondary | ICD-10-CM | POA: Insufficient documentation

## 2020-08-02 DIAGNOSIS — Z01812 Encounter for preprocedural laboratory examination: Secondary | ICD-10-CM | POA: Insufficient documentation

## 2020-08-02 LAB — CBC
HCT: 41.7 % (ref 36.0–46.0)
Hemoglobin: 13.6 g/dL (ref 12.0–15.0)
MCH: 31 pg (ref 26.0–34.0)
MCHC: 32.6 g/dL (ref 30.0–36.0)
MCV: 95 fL (ref 80.0–100.0)
Platelets: 205 10*3/uL (ref 150–400)
RBC: 4.39 MIL/uL (ref 3.87–5.11)
RDW: 12.9 % (ref 11.5–15.5)
WBC: 2.7 10*3/uL — ABNORMAL LOW (ref 4.0–10.5)
nRBC: 0 % (ref 0.0–0.2)

## 2020-08-02 LAB — SARS CORONAVIRUS 2 (TAT 6-24 HRS): SARS Coronavirus 2: NEGATIVE

## 2020-08-05 ENCOUNTER — Encounter (HOSPITAL_COMMUNITY)
Admission: AD | Disposition: A | Discharge status: Home / Self Care | Payer: Self-pay | Source: Home / Self Care | Attending: Obstetrics

## 2020-08-05 ENCOUNTER — Other Ambulatory Visit: Payer: Self-pay

## 2020-08-05 ENCOUNTER — Ambulatory Visit (HOSPITAL_BASED_OUTPATIENT_CLINIC_OR_DEPARTMENT_OTHER): Payer: BC Managed Care – PPO | Admitting: Certified Registered Nurse Anesthetist

## 2020-08-05 ENCOUNTER — Inpatient Hospital Stay (HOSPITAL_BASED_OUTPATIENT_CLINIC_OR_DEPARTMENT_OTHER)
Admission: AD | Admit: 2020-08-05 | Discharge: 2020-08-07 | DRG: 743 | Disposition: A | Discharge status: Home / Self Care | Payer: BC Managed Care – PPO | Attending: Obstetrics | Admitting: Obstetrics

## 2020-08-05 ENCOUNTER — Inpatient Hospital Stay: Admit: 2020-08-05 | Payer: BC Managed Care – PPO | Admitting: Obstetrics

## 2020-08-05 ENCOUNTER — Encounter (HOSPITAL_BASED_OUTPATIENT_CLINIC_OR_DEPARTMENT_OTHER): Payer: Self-pay | Admitting: Obstetrics

## 2020-08-05 ENCOUNTER — Inpatient Hospital Stay (HOSPITAL_COMMUNITY): Payer: BC Managed Care – PPO

## 2020-08-05 ENCOUNTER — Inpatient Hospital Stay (HOSPITAL_COMMUNITY): Payer: BC Managed Care – PPO | Admitting: Certified Registered Nurse Anesthetist

## 2020-08-05 DIAGNOSIS — D259 Leiomyoma of uterus, unspecified: Principal | ICD-10-CM | POA: Diagnosis present

## 2020-08-05 DIAGNOSIS — N801 Endometriosis of ovary: Secondary | ICD-10-CM | POA: Diagnosis present

## 2020-08-05 DIAGNOSIS — N920 Excessive and frequent menstruation with regular cycle: Secondary | ICD-10-CM | POA: Diagnosis present

## 2020-08-05 DIAGNOSIS — N800 Endometriosis of uterus: Secondary | ICD-10-CM | POA: Diagnosis present

## 2020-08-05 DIAGNOSIS — Z90710 Acquired absence of both cervix and uterus: Secondary | ICD-10-CM | POA: Diagnosis present

## 2020-08-05 DIAGNOSIS — N736 Female pelvic peritoneal adhesions (postinfective): Secondary | ICD-10-CM | POA: Diagnosis present

## 2020-08-05 DIAGNOSIS — Z79899 Other long term (current) drug therapy: Secondary | ICD-10-CM

## 2020-08-05 DIAGNOSIS — Z20822 Contact with and (suspected) exposure to covid-19: Secondary | ICD-10-CM | POA: Diagnosis present

## 2020-08-05 HISTORY — PX: CYSTOSCOPY/RETROGRADE/URETEROSCOPY: SHX5316

## 2020-08-05 HISTORY — PX: ROBOTIC ASSISTED LAPAROSCOPIC HYSTERECTOMY AND SALPINGECTOMY: SHX6379

## 2020-08-05 HISTORY — DX: Leiomyoma of uterus, unspecified: D25.9

## 2020-08-05 HISTORY — PX: PROCTOSCOPY: SHX2266

## 2020-08-05 LAB — BASIC METABOLIC PANEL
Anion gap: 8 (ref 5–15)
BUN: 10 mg/dL (ref 6–20)
CO2: 24 mmol/L (ref 22–32)
Calcium: 8.3 mg/dL — ABNORMAL LOW (ref 8.9–10.3)
Chloride: 106 mmol/L (ref 98–111)
Creatinine, Ser: 0.68 mg/dL (ref 0.44–1.00)
GFR, Estimated: >60 mL/min (ref >60)
Glucose, Bld: 128 mg/dL — ABNORMAL HIGH (ref 70–99)
Potassium: 3.7 mmol/L (ref 3.5–5.1)
Sodium: 138 mmol/L (ref 135–145)

## 2020-08-05 LAB — CBC
HCT: 43.2 % (ref 36.0–46.0)
HCT: 33.2 % — ABNORMAL LOW (ref 36.0–46.0)
Hemoglobin: 14.2 g/dL (ref 12.0–15.0)
Hemoglobin: 10.9 g/dL — ABNORMAL LOW (ref 12.0–15.0)
MCH: 30.6 pg (ref 26.0–34.0)
MCH: 31.2 pg (ref 26.0–34.0)
MCHC: 32.9 g/dL (ref 30.0–36.0)
MCHC: 32.8 g/dL (ref 30.0–36.0)
MCV: 93.1 fL (ref 80.0–100.0)
MCV: 95.1 fL (ref 80.0–100.0)
Platelets: 235 10*3/uL (ref 150–400)
Platelets: 164 10*3/uL (ref 150–400)
RBC: 4.64 MIL/uL (ref 3.87–5.11)
RBC: 3.49 MIL/uL — ABNORMAL LOW (ref 3.87–5.11)
RDW: 12.9 % (ref 11.5–15.5)
RDW: 12.8 % (ref 11.5–15.5)
WBC: 2.8 10*3/uL — ABNORMAL LOW (ref 4.0–10.5)
WBC: 14.5 10*3/uL — ABNORMAL HIGH (ref 4.0–10.5)
nRBC: 0 % (ref 0.0–0.2)
nRBC: 0 % (ref 0.0–0.2)

## 2020-08-05 LAB — CBC WITH DIFFERENTIAL/PLATELET
Abs Immature Granulocytes: 0.04 10*3/uL (ref 0.00–0.07)
Abs Immature Granulocytes: 0.03 10*3/uL (ref 0.00–0.07)
Basophils Absolute: 0 10*3/uL (ref 0.0–0.1)
Basophils Absolute: 0 10*3/uL (ref 0.0–0.1)
Basophils Relative: 0 %
Basophils Relative: 0 %
Eosinophils Absolute: 0 10*3/uL (ref 0.0–0.5)
Eosinophils Absolute: 0 10*3/uL (ref 0.0–0.5)
Eosinophils Relative: 0 %
Eosinophils Relative: 0 %
HCT: 30.1 % — ABNORMAL LOW (ref 36.0–46.0)
HCT: 29.8 % — ABNORMAL LOW (ref 36.0–46.0)
Hemoglobin: 10.2 g/dL — ABNORMAL LOW (ref 12.0–15.0)
Hemoglobin: 9.8 g/dL — ABNORMAL LOW (ref 12.0–15.0)
Immature Granulocytes: 0 %
Immature Granulocytes: 0 %
Lymphocytes Relative: 2 %
Lymphocytes Relative: 3 %
Lymphs Abs: 0.3 10*3/uL — ABNORMAL LOW (ref 0.7–4.0)
Lymphs Abs: 0.4 10*3/uL — ABNORMAL LOW (ref 0.7–4.0)
MCH: 31.5 pg (ref 26.0–34.0)
MCH: 31.2 pg (ref 26.0–34.0)
MCHC: 33.9 g/dL (ref 30.0–36.0)
MCHC: 32.9 g/dL (ref 30.0–36.0)
MCV: 92.9 fL (ref 80.0–100.0)
MCV: 94.9 fL (ref 80.0–100.0)
Monocytes Absolute: 0.5 10*3/uL (ref 0.1–1.0)
Monocytes Absolute: 0.5 10*3/uL (ref 0.1–1.0)
Monocytes Relative: 4 %
Monocytes Relative: 5 %
Neutro Abs: 11.8 10*3/uL — ABNORMAL HIGH (ref 1.7–7.7)
Neutro Abs: 10.4 10*3/uL — ABNORMAL HIGH (ref 1.7–7.7)
Neutrophils Relative %: 94 %
Neutrophils Relative %: 92 %
Platelets: 166 10*3/uL (ref 150–400)
Platelets: 172 10*3/uL (ref 150–400)
RBC: 3.24 MIL/uL — ABNORMAL LOW (ref 3.87–5.11)
RBC: 3.14 MIL/uL — ABNORMAL LOW (ref 3.87–5.11)
RDW: 12.6 % (ref 11.5–15.5)
RDW: 12.6 % (ref 11.5–15.5)
WBC: 12.7 10*3/uL — ABNORMAL HIGH (ref 4.0–10.5)
WBC: 11.4 10*3/uL — ABNORMAL HIGH (ref 4.0–10.5)
nRBC: 0 % (ref 0.0–0.2)
nRBC: 0 % (ref 0.0–0.2)

## 2020-08-05 LAB — DIC (DISSEMINATED INTRAVASCULAR COAGULATION)PANEL
D-Dimer, Quant: 3.79 ug/mL-FEU — ABNORMAL HIGH (ref 0.00–0.50)
Fibrinogen: 153 mg/dL — ABNORMAL LOW (ref 210–475)
INR: 1.2 (ref 0.8–1.2)
Platelets: 163 10*3/uL (ref 150–400)
Prothrombin Time: 14.9 seconds (ref 11.4–15.2)
Smear Review: NONE SEEN
aPTT: 32 seconds (ref 24–36)

## 2020-08-05 LAB — PREPARE RBC (CROSSMATCH)

## 2020-08-05 LAB — POCT PREGNANCY, URINE: Preg Test, Ur: NEGATIVE

## 2020-08-05 LAB — ABO/RH: ABO/RH(D): A POS

## 2020-08-05 SURGERY — LAPAROTOMY, EXPLORATORY
Anesthesia: General

## 2020-08-05 SURGERY — XI ROBOTIC ASSISTED LAPAROSCOPIC HYSTERECTOMY AND SALPINGECTOMY
Anesthesia: General | Site: Rectum | Laterality: Bilateral

## 2020-08-05 MED ORDER — PROPOFOL 10 MG/ML IV BOLUS
INTRAVENOUS | Status: DC | PRN
  Administered 2020-08-05: 120 mg via INTRAVENOUS

## 2020-08-05 MED ORDER — OXYCODONE HCL 5 MG/5ML PO SOLN: 5.0000 mg | Freq: Once | ORAL | Status: DC | PRN

## 2020-08-05 MED ORDER — HYDROMORPHONE HCL 1 MG/ML IJ SOLN: 0.2000 mg | INTRAMUSCULAR | Status: DC | PRN

## 2020-08-05 MED ORDER — ROCURONIUM BROMIDE 10 MG/ML (PF) SYRINGE
PREFILLED_SYRINGE | INTRAVENOUS | Status: AC
  Filled 2020-08-05: qty 10

## 2020-08-05 MED ORDER — FENTANYL CITRATE (PF) 100 MCG/2ML IJ SOLN
INTRAMUSCULAR | Status: DC | PRN
  Administered 2020-08-05 (×7): 50 ug via INTRAVENOUS

## 2020-08-05 MED ORDER — SUGAMMADEX SODIUM 200 MG/2ML IV SOLN
INTRAVENOUS | Status: DC | PRN
  Administered 2020-08-05: 200 mg via INTRAVENOUS

## 2020-08-05 MED ORDER — ONDANSETRON HCL 4 MG/2ML IJ SOLN
INTRAMUSCULAR | Status: AC
  Filled 2020-08-05: qty 2

## 2020-08-05 MED ORDER — LIDOCAINE HCL (PF) 2 % IJ SOLN
INTRAMUSCULAR | Status: AC
  Filled 2020-08-05: qty 5

## 2020-08-05 MED ORDER — FENTANYL CITRATE (PF) 100 MCG/2ML IJ SOLN
INTRAMUSCULAR | Status: AC
  Filled 2020-08-05: qty 2

## 2020-08-05 MED ORDER — SODIUM CHLORIDE 0.9 % IV SOLN: INTRAVENOUS | Status: DC

## 2020-08-05 MED ORDER — CEFAZOLIN SODIUM-DEXTROSE 2-4 GM/100ML-% IV SOLN
2.0000 g | INTRAVENOUS | Status: AC
  Administered 2020-08-05 (×2): 2 g via INTRAVENOUS

## 2020-08-05 MED ORDER — TRANEXAMIC ACID-NACL 1000-0.7 MG/100ML-% IV SOLN
INTRAVENOUS | Status: AC
  Filled 2020-08-05: qty 100

## 2020-08-05 MED ORDER — ONDANSETRON HCL 4 MG PO TABS: 4.0000 mg | ORAL_TABLET | Freq: Four times a day (QID) | ORAL | Status: DC | PRN

## 2020-08-05 MED ORDER — KETOROLAC TROMETHAMINE 30 MG/ML IJ SOLN: 30.0000 mg | Freq: Once | INTRAMUSCULAR | Status: DC

## 2020-08-05 MED ORDER — MENTHOL 3 MG MT LOZG: 1.0000 | LOZENGE | OROMUCOSAL | Status: DC | PRN

## 2020-08-05 MED ORDER — KETOROLAC TROMETHAMINE 30 MG/ML IJ SOLN
INTRAMUSCULAR | Status: AC
  Filled 2020-08-05: qty 1

## 2020-08-05 MED ORDER — VASOPRESSIN 20 UNIT/ML IV SOLN
INTRAVENOUS | Status: DC | PRN
  Administered 2020-08-05: 8 [IU]

## 2020-08-05 MED ORDER — PROPOFOL 500 MG/50ML IV EMUL
INTRAVENOUS | Status: AC
  Filled 2020-08-05: qty 50

## 2020-08-05 MED ORDER — TRANEXAMIC ACID-NACL 1000-0.7 MG/100ML-% IV SOLN
1000.0000 mg | INTRAVENOUS | Status: AC
  Administered 2020-08-05: 1000 mg via INTRAVENOUS

## 2020-08-05 MED ORDER — PANTOPRAZOLE SODIUM 40 MG PO TBEC
40.0000 mg | DELAYED_RELEASE_TABLET | Freq: Every day | ORAL | Status: DC
  Administered 2020-08-06 – 2020-08-07 (×2): 40 mg via ORAL
  Filled 2020-08-05 (×2): qty 1

## 2020-08-05 MED ORDER — SIMETHICONE 80 MG PO CHEW: 80.0000 mg | CHEWABLE_TABLET | Freq: Four times a day (QID) | ORAL | Status: DC | PRN

## 2020-08-05 MED ORDER — CEFAZOLIN SODIUM-DEXTROSE 2-4 GM/100ML-% IV SOLN
INTRAVENOUS | Status: AC
  Filled 2020-08-05: qty 100

## 2020-08-05 MED ORDER — LACTATED RINGERS IV SOLN: INTRAVENOUS | Status: DC

## 2020-08-05 MED ORDER — FENTANYL CITRATE (PF) 100 MCG/2ML IJ SOLN
25.0000 ug | INTRAMUSCULAR | Status: DC | PRN
  Administered 2020-08-05 (×5): 25 ug via INTRAVENOUS

## 2020-08-05 MED ORDER — PANTOPRAZOLE SODIUM 40 MG PO TBEC: 40.0000 mg | DELAYED_RELEASE_TABLET | Freq: Every day | ORAL | Status: DC

## 2020-08-05 MED ORDER — ONDANSETRON HCL 4 MG/2ML IJ SOLN: 4.0000 mg | Freq: Four times a day (QID) | INTRAMUSCULAR | Status: DC | PRN

## 2020-08-05 MED ORDER — AMISULPRIDE (ANTIEMETIC) 5 MG/2ML IV SOLN: 10.0000 mg | Freq: Once | INTRAVENOUS | Status: DC | PRN

## 2020-08-05 MED ORDER — ONDANSETRON HCL 4 MG/2ML IJ SOLN
4.0000 mg | Freq: Four times a day (QID) | INTRAMUSCULAR | Status: DC | PRN
  Administered 2020-08-07: 4 mg via INTRAVENOUS
  Filled 2020-08-05: qty 2

## 2020-08-05 MED ORDER — ONDANSETRON HCL 4 MG/2ML IJ SOLN: 4.0000 mg | Freq: Once | INTRAMUSCULAR | Status: DC | PRN

## 2020-08-05 MED ORDER — IBUPROFEN 800 MG PO TABS
800.0000 mg | ORAL_TABLET | Freq: Three times a day (TID) | ORAL | Status: DC
  Administered 2020-08-06 – 2020-08-07 (×5): 800 mg via ORAL
  Filled 2020-08-05 (×6): qty 1

## 2020-08-05 MED ORDER — DEXAMETHASONE SODIUM PHOSPHATE 10 MG/ML IJ SOLN
INTRAMUSCULAR | Status: AC
  Filled 2020-08-05: qty 1

## 2020-08-05 MED ORDER — LIDOCAINE 2% (20 MG/ML) 5 ML SYRINGE
INTRAMUSCULAR | Status: DC | PRN
  Administered 2020-08-05: 60 mg via INTRAVENOUS

## 2020-08-05 MED ORDER — CHLORHEXIDINE GLUCONATE CLOTH 2 % EX PADS
6.0000 | MEDICATED_PAD | Freq: Every day | CUTANEOUS | Status: DC
  Administered 2020-08-06 (×2): 6 via TOPICAL

## 2020-08-05 MED ORDER — INDOCYANINE GREEN 25 MG IV SOLR
INTRAVENOUS | Status: DC | PRN
  Administered 2020-08-05: 50 mg

## 2020-08-05 MED ORDER — CEFAZOLIN SODIUM 1 G IJ SOLR
INTRAMUSCULAR | Status: AC
  Filled 2020-08-05: qty 20

## 2020-08-05 MED ORDER — DEXAMETHASONE SODIUM PHOSPHATE 10 MG/ML IJ SOLN
INTRAMUSCULAR | Status: DC | PRN
  Administered 2020-08-05: 5 mg via INTRAVENOUS

## 2020-08-05 MED ORDER — MIDAZOLAM HCL 2 MG/2ML IJ SOLN
INTRAMUSCULAR | Status: AC
  Filled 2020-08-05: qty 2

## 2020-08-05 MED ORDER — BUPIVACAINE HCL (PF) 0.25 % IJ SOLN
INTRAMUSCULAR | Status: DC | PRN
  Administered 2020-08-05: 15 mL

## 2020-08-05 MED ORDER — ALBUMIN HUMAN 5 % IV SOLN: INTRAVENOUS | Status: DC | PRN

## 2020-08-05 MED ORDER — FENTANYL CITRATE (PF) 250 MCG/5ML IJ SOLN
INTRAMUSCULAR | Status: AC
  Filled 2020-08-05: qty 5

## 2020-08-05 MED ORDER — ROCURONIUM BROMIDE 10 MG/ML (PF) SYRINGE
PREFILLED_SYRINGE | INTRAVENOUS | Status: DC | PRN
  Administered 2020-08-05 (×2): 30 mg via INTRAVENOUS
  Administered 2020-08-05: 50 mg via INTRAVENOUS
  Administered 2020-08-05: 20 mg via INTRAVENOUS
  Administered 2020-08-05: 10 mg via INTRAVENOUS

## 2020-08-05 MED ORDER — HYDROMORPHONE HCL 1 MG/ML IJ SOLN
0.2000 mg | INTRAMUSCULAR | Status: DC | PRN
  Administered 2020-08-05 – 2020-08-06 (×2): 0.6 mg via INTRAVENOUS
  Filled 2020-08-05 (×2): qty 1

## 2020-08-05 MED ORDER — CEFAZOLIN SODIUM-DEXTROSE 2-4 GM/100ML-% IV SOLN: 2.0000 g | Freq: Once | INTRAVENOUS | Status: DC

## 2020-08-05 MED ORDER — ONDANSETRON HCL 4 MG PO TABS
4.0000 mg | ORAL_TABLET | Freq: Four times a day (QID) | ORAL | Status: DC | PRN
  Administered 2020-08-07: 4 mg via ORAL
  Filled 2020-08-05: qty 1

## 2020-08-05 MED ORDER — MIDAZOLAM HCL 2 MG/2ML IJ SOLN
INTRAMUSCULAR | Status: DC | PRN
  Administered 2020-08-05: 2 mg via INTRAVENOUS

## 2020-08-05 MED ORDER — OXYCODONE HCL 5 MG PO TABS: 5.0000 mg | ORAL_TABLET | Freq: Once | ORAL | Status: DC | PRN

## 2020-08-05 MED ORDER — POVIDONE-IODINE 10 % EX SWAB: 2.0000 "application " | Freq: Once | CUTANEOUS | Status: DC

## 2020-08-05 MED ORDER — SODIUM CHLORIDE 0.9 % IV SOLN
INTRAVENOUS | Status: DC | PRN
  Administered 2020-08-05: .01 mL

## 2020-08-05 MED ORDER — OXYCODONE HCL 5 MG PO TABS
5.0000 mg | ORAL_TABLET | ORAL | Status: DC | PRN
  Administered 2020-08-06 – 2020-08-07 (×4): 10 mg via ORAL
  Filled 2020-08-05 (×4): qty 2

## 2020-08-05 MED ORDER — ONDANSETRON HCL 4 MG/2ML IJ SOLN
INTRAMUSCULAR | Status: DC | PRN
  Administered 2020-08-05: 4 mg via INTRAVENOUS

## 2020-08-05 MED ORDER — TRANEXAMIC ACID 1000 MG/10ML IV SOLN: 1000.0000 mg | Freq: Once | INTRAVENOUS | Status: DC

## 2020-08-05 SURGICAL SUPPLY — 80 items
APPLICATOR ARISTA FLEXITIP XL (MISCELLANEOUS) ×4 IMPLANT
APPLIER CLIP 5 13 M/L LIGAMAX5 (MISCELLANEOUS)
BARRIER ADHS 3X4 INTERCEED (GAUZE/BANDAGES/DRESSINGS) ×4 IMPLANT
CATH INTERMIT  6FR 70CM (CATHETERS) ×4 IMPLANT
CATH URET 5FR 28IN OPEN ENDED (CATHETERS) ×8 IMPLANT
CHLORAPREP W/TINT 26 (MISCELLANEOUS) ×4 IMPLANT
CLIP APPLIE 5 13 M/L LIGAMAX5 (MISCELLANEOUS) IMPLANT
CLIP VESOLOCK MED LG 6/CT (CLIP) ×4 IMPLANT
CNTNR URN SCR LID CUP LEK RST (MISCELLANEOUS) ×3 IMPLANT
CONT SPEC 4OZ STRL OR WHT (MISCELLANEOUS) ×1
COVER BACK TABLE 60X90IN (DRAPES) ×4 IMPLANT
COVER TIP SHEARS 8 DVNC (MISCELLANEOUS) ×3 IMPLANT
COVER TIP SHEARS 8MM DA VINCI (MISCELLANEOUS) ×1
COVER WAND RF STERILE (DRAPES) ×4 IMPLANT
DECANTER SPIKE VIAL GLASS SM (MISCELLANEOUS) ×8 IMPLANT
DEFOGGER SCOPE WARMER CLEARIFY (MISCELLANEOUS) ×4 IMPLANT
DERMABOND ADVANCED (GAUZE/BANDAGES/DRESSINGS) ×1
DERMABOND ADVANCED .7 DNX12 (GAUZE/BANDAGES/DRESSINGS) ×3 IMPLANT
DILATOR CANAL MILEX (MISCELLANEOUS) ×4 IMPLANT
DRAIN CHANNEL 10F 3/8 F FF (DRAIN) ×4 IMPLANT
DRAIN CHANNEL 19F RND (DRAIN) ×4 IMPLANT
DRAPE ARM DVNC X/XI (DISPOSABLE) ×12 IMPLANT
DRAPE COLUMN DVNC XI (DISPOSABLE) ×3 IMPLANT
DRAPE DA VINCI XI ARM (DISPOSABLE) ×4
DRAPE DA VINCI XI COLUMN (DISPOSABLE) ×1
DRAPE UTILITY XL STRL (DRAPES) ×4 IMPLANT
DRSG TEGADERM 4X4.75 (GAUZE/BANDAGES/DRESSINGS) ×4 IMPLANT
ELECT REM PT RETURN 9FT ADLT (ELECTROSURGICAL) ×4
ELECTRODE REM PT RTRN 9FT ADLT (ELECTROSURGICAL) ×3 IMPLANT
EVACUATOR SILICONE 100CC (DRAIN) ×4 IMPLANT
GAUZE SPONGE 4X4 12PLY STRL LF (GAUZE/BANDAGES/DRESSINGS) ×4 IMPLANT
GLOVE SURG ENC MOIS LTX SZ6.5 (GLOVE) ×12 IMPLANT
GLOVE SURG UNDER POLY LF SZ7 (GLOVE) ×32 IMPLANT
GOWN STRL REUS W/TWL LRG LVL3 (GOWN DISPOSABLE) ×20 IMPLANT
GUIDEWIRE STR DUAL SENSOR (WIRE) ×4 IMPLANT
GUIDEWIRE ZIPWRE .038 STRAIGHT (WIRE) ×4 IMPLANT
HEMOSTAT ARISTA ABSORB 3G PWDR (HEMOSTASIS) ×4 IMPLANT
HOLDER FOLEY CATH W/STRAP (MISCELLANEOUS) IMPLANT
IRRIG SUCT STRYKERFLOW 2 WTIP (MISCELLANEOUS) ×4
IRRIGATION SUCT STRKRFLW 2 WTP (MISCELLANEOUS) ×3 IMPLANT
IV NS 1000ML (IV SOLUTION) ×1
IV NS 1000ML BAXH (IV SOLUTION) ×3 IMPLANT
KIT PROCEDURE DA VINCI SI (MISCELLANEOUS) ×2
KIT PROCEDURE DVNC SI (MISCELLANEOUS) ×6 IMPLANT
KIT TURNOVER CYSTO (KITS) ×4 IMPLANT
LEGGING LITHOTOMY PAIR STRL (DRAPES) ×4 IMPLANT
LOOP VESSEL MAXI BLUE (MISCELLANEOUS) ×4 IMPLANT
OBTURATOR OPTICAL STANDARD 8MM (TROCAR) ×1
OBTURATOR OPTICAL STND 8 DVNC (TROCAR) ×3
OBTURATOR OPTICALSTD 8 DVNC (TROCAR) ×3 IMPLANT
OCCLUDER COLPOPNEUMO (BALLOONS) ×4 IMPLANT
PACK ROBOT WH (CUSTOM PROCEDURE TRAY) ×4 IMPLANT
PACK ROBOTIC GOWN (GOWN DISPOSABLE) ×4 IMPLANT
PACK TRENDGUARD 450 HYBRID PRO (MISCELLANEOUS) IMPLANT
PAD OB MATERNITY 4.3X12.25 (PERSONAL CARE ITEMS) ×4 IMPLANT
PAD PREP 24X48 CUFFED NSTRL (MISCELLANEOUS) ×4 IMPLANT
PROTECTOR NERVE ULNAR (MISCELLANEOUS) ×8 IMPLANT
SEAL CANN UNIV 5-8 DVNC XI (MISCELLANEOUS) ×12 IMPLANT
SEAL XI 5MM-8MM UNIVERSAL (MISCELLANEOUS) ×4
SEALER VESSEL DA VINCI XI (MISCELLANEOUS) ×2
SEALER VESSEL EXT DVNC XI (MISCELLANEOUS) ×6 IMPLANT
SET TRI-LUMEN FLTR TB AIRSEAL (TUBING) ×4 IMPLANT
STENT URET 6FRX24 CONTOUR (STENTS) ×4 IMPLANT
SUT VIC AB 0 CT1 27 (SUTURE) ×2
SUT VIC AB 0 CT1 27XBRD ANBCTR (SUTURE) ×6 IMPLANT
SUT VIC AB 4-0 PS2 27 (SUTURE) ×8 IMPLANT
SUT VICRYL 0 UR6 27IN ABS (SUTURE) ×4 IMPLANT
SUT VLOC 180 0 9IN  GS21 (SUTURE) ×2
SUT VLOC 180 0 9IN GS21 (SUTURE) ×6 IMPLANT
TIP RUMI ORANGE 6.7MMX12CM (TIP) IMPLANT
TIP UTERINE 5.1X6CM LAV DISP (MISCELLANEOUS) IMPLANT
TIP UTERINE 6.7X10CM GRN DISP (MISCELLANEOUS) IMPLANT
TIP UTERINE 6.7X6CM WHT DISP (MISCELLANEOUS) IMPLANT
TIP UTERINE 6.7X8CM BLUE DISP (MISCELLANEOUS) IMPLANT
TOWEL OR 17X26 10 PK STRL BLUE (TOWEL DISPOSABLE) ×4 IMPLANT
TRAY FOLEY W/BAG SLVR 14FR LF (SET/KITS/TRAYS/PACK) ×4 IMPLANT
TRENDGUARD 450 HYBRID PRO PACK (MISCELLANEOUS)
TROCAR PORT AIRSEAL 5X120 (TROCAR) ×4 IMPLANT
TUBING TUR DISP (UROLOGICAL SUPPLIES) ×8 IMPLANT
WATER STERILE IRR 3000ML UROMA (IV SOLUTION) ×8 IMPLANT

## 2020-08-05 NOTE — Op Note (Signed)
08/05/2020  3:21 PM  PATIENT:  [name]  45 y.o. female  PRE-OPERATIVE DIAGNOSIS:  Uterine Fibroids, menorrhagia, iron deficiency anemia, suspected endometriosis  POST-OPERATIVE DIAGNOSIS:  Uterine Fibroids, menorrhagia, iron deficiency anemia, stage IV endometriosis with significant adhesions  PROCEDURE:  Procedure(s) with comments: XI ROBOTIC ASSISTED LAPAROSCOPIC HYSTERECTOMY AND SALPINGECTOMY (Bilateral) CYSTOSCOPY/RETROGRADE/URETEROSCOPY AND BILATERAL STENT PLACWMENT - DR ESKRIDGE PROCTOSCOPY - DR Marcello Moores  Ureterolysis, enterolysis, lysis of adhesions  SURGEON:  Surgeon(s) and Role:    * Aloha Gell, MD - Primary    * Festus Aloe, MD - Assisting    * Alexis Frock, MD - Assisting    * Leighton Ruff, MD - Assisting    * Law, Cassandra A, DO - Assisting  PHYSICIAN ASSISTANT: As above  ASSISTANTS: As above  ANESTHESIA:   general EBL:  350 mL   BLOOD ADMINISTERED:none  DRAINS: (1) Jackson-Pratt drain(s) with closed bulb suction in the Pelvis and Urinary Catheter (Foley)   LOCAL MEDICATIONS USED:  NONE  SPECIMEN: Uterus, cervix, bilateral fallopian tubes  DISPOSITION OF SPECIMEN:  PATHOLOGY  COUNTS:  YES  TOURNIQUET:  * No tourniquets in log *  DICTATION: .Dragon Dictation  PLAN OF CARE: Admit to inpatient   PATIENT DISPOSITION:  PACU - hemodynamically stable.   Delay start of Pharmacological VTE agent (>24hrs) due to surgical blood loss or risk of bleeding: yes  Procedure:Robotic-assisted total laparoscopic hysterectomy with BSO Complications: none Antibiotics: 2 g Ancef, repeated at the 4-hour mark for total of 2 doses Findings: Enlarged fibroid uterus, 350cc, stage IV endometriosis with b/l ovaries and tubes adhesed in the midline posterior to the uterus, also adhesed to the back of the uterus and the rectum.  Adhesions in the bilateral pelvic sidewall with abnormal course of ureter tracking more medially than usual and coursing on the lateral  edge of the cervix,  nl gallbladder, nl liver edge, normal appendix.  Indications: This is a 45 year old G1, P1 with history of menorrhagia, anemia, fibroids and history of ovarian cyst consistent with endometrioma though these have not been seen recently given her hormonal suppression.  Given her menorrhagia which has brought her hemoglobin down to 8 decision was made to proceed with robotic assisted total laparoscopic hysterectomy with bilateral salpingectomy and ovarian retention.  Preoperatively patient did 4 months of Lupron and IV iron and was able to improve her hemoglobin to 14 with Lupron induced amenorrhea.  Of note patient was unable to have significant improvement in her periods despite oral contraceptives..   Procedure: After informed consent obtained, the patient was taken to the operating room where general anesthesia was initiated without difficulty.   She was prepped and draped in normal sterile fashion in the dorsal supine lithotomy position. A Foley catheter was inserted sterilely into the bladder. A bimanual examination was done to assess the size and position of the uterus. A weighted speculum was placed in the vagina and deaver retractors were used on the anterior vaginal wall. .The cervix was grasped with tenaculum. Cervix was unable to be easily dilated.  With manipulation of the uterus and cervix eventually an os finder was able to be passed but I had continued difficulty in passing Pratt and Hegar dilators.  I went all the way down to lacrimal duct dilators but was unable to adequately enter the endometrial cavity.  I was able to pass a 15 mm Hegar dilator about 6 cm in but attempt to place the Rumi 6 cm shaft was unsuccessful and the tip balloon popped.  Then made a decision to move to an 8 cm shaft knowing that I would likely perforate the uterus in order to get manipulation and a Rumi-Koh ring around the cervix. The cervix was assessed to identify the Rumi-Co size.  A small cup and  an 8 cm shaft was used. The uterine balloon was inflated.  Probable perforation was identified.  Gloved were changed. Attention was then turned to the patient's abdomen. 0.5 % marcaine was used prior to all incision. A total of 10 cc of marcaine was used.  A 8 mm incision was made in the umbilicus.  Using a 5 French camera and Optiview trocar the camera was placed into the peritoneal cavity.  Pneumoperitoneum was created to 15 mmHg. The abdominal wall was assessed and additional port sites were marked. 8 mm incisions were placed in the right (two) and left lower quadrants (2) and non-bladed ports were placed under direct visualization. The robot was brought to the patient's side and attached with the right side docking. The robotic instruments were placed under direct visualization until proper placement just over the uterus.   I then went to the robotic console.  Large fibroid uterus was noted.  Quick look in the upper abdomen revealed normal appendix, normal liver edge, normal gallbladder.  Uterine perforation with theRumi-Koh as expected. The ovaries were found posterior to the uterus stuck together in the midline with fallopian tubes torsed around the ovaries.  About 1 hour was spent in blunt dissection.  This was above and beyond the usual time for dissection in order to proceed with hysterectomy.  I started by separating the ovaries from each other in the midline, then separating the ovaries from the back of the uterus and from their respective fallopian tubes.  As I was freeing the ovaries from the back of the uterus I noted the rectum adhesed to the back of the uterus and began with blunt enterolysis.  As the dissection continued the sigmoid was found to be adhesed in the left pelvic sidewall.  Given the extent of the adhesions I called for general surgery assistance.  Once the left fallopian tube was freed from the bundle of adhesions and the left ovary and infundibulopelvic ligament were identified  the decision was made to proceed with left salpingectomy.  Starting at the fimbriated end and using the vessel sealer device I did serial cautery and transection along the left mesosalpinx between the ovary and fallopian tube to free the fallopian tube from the ovary.  I then transected the left round ligament and moved to serially cauterizing and transecting the ovary from the uterus by coming through the left utero-ovarian ligament.  At this point the ovary was free from the uterus and tube and placed on the lateral wall.  Significant adhesions were noted in the posterior leaf of the broad ligament.  At this point Dr. Leighton Ruff of general surgery came in and assisted in left ureterolysis and enterolysis of the rectum from the posterior uterus and the sigmoid from the lateral sidewall.  Her note can be found in a separate dictation.  Her dissection revealed the ureter to be coursing medially in the broad ligament and running just on the edge of the cervix to implant into the bladder.  Urology was then called to place ureteral stent and help with additional ureterolysis on the left and ureterolysis on the right side.  While awaiting for urology I moved to the right side of the pelvis.  The ureter was seen  at the pelvic brim but was unable to follow the ureter through the pelvis.  The right fallopian tube was elevated and the vessel sealer was used to serially cauterize and transect the fallopian tube from the ovary by coming through the mesosalpinx.  The right round ligament was transected and divided.  The anterior leaf of the broad ligament was opened and dissection was carried down to release the bladder flap.  The right utero-ovarian ligament was serially cauterized and divided.  The right ovary was deviated laterally to the pelvic sidewall and the right tube was stained with the uterus.  Urology came and placed stents.  They also were able to use firefly device in the stents for better visualization  robotically of the ureters.  See Dr. Junious Silk notes for further details.  Dr. Tresa Moore also came in for a intraoperative consult and assisted with additional ureterolysis on the left and right side.  See his note for details.   With the ureters better visualized, additional dissection was done to create a bladder flap to deviate the bladder away from the cervix.  The left then right uterine artery was cauterized and transected.  Firefly was used often during this cautery as the uterine arteries were in close proximity to the ureters.  At this point with the pressure inward on the Rumi, the anterior colpotomy was made with the monopolar scissors. This was carried around to the patient's right side. Additional bipolar cautery was used on the  both angles of the cuff, again with the use of firefly- this was carried out with the vessel sealer.  Laparoscopic instruments were also used to deviate the ureter away from the colpotomy incision.  The uterus was then positioned  anteriorly  to allow easy access to the posterior  colpotomy. This was carried out with the monopolar scissors.  Good hemostasis was noted along the angles of the incision.  I then went to deliver the uterus vaginally.  Initial manipulation did not lead to delivery of the uterus due to its bulky size with multiple fibroids.  Using combination of tenaculum, Deaver retractors, towel clips the uterus was positioned in different angles to allow delivery but still did not come.  I then used Mayo scissors to bivalve the cervix and lower uterine segment.  During this dissection several fibroids were grasped and removed to decrease the bulk of the uterus.  After about 20 minutes the uterus, cervix, and fallopian tubes were delivered.  Pneumoperitoneum was reestablished.  Irrigation was used and the vaginal cuff appeared hemostatic. A 9 inch V-lock suture was placed though the robotic port. Instruments were changed to allow a suture cut needle driver  through the #3 port and and a long-tipped forceps through the #1 port. The right angle was closed and locked with the V-lock suture. Running suture was carried out to the L angle. The suture was cut and removed.  An opening was noted in the midpoint of the vaginal cuff and an additional 9 inch V-Loc suture was used to close the space.  Using a hand in the vagina the vagina was once again checked and found to be closed and hemostatic.   Suction irrigation was carried out however there was a lot of denuded serosal surfaces that had slow ooze but no active arterial bleeding.  Some more aggressive bleeding was noted in the right uterine artery pedicle.  This was controlled with the vessel sealer with close attention to ureteral placement using the firefly device.  The ureter  was quite close to the uterine artery but I was able to achieve hemostasis and stay clear of the ureter.  There was additional bleeding around the left ovary and additional cautery was done there this was felt to be hemostatic at the end.  A paraovarian cyst on the right side was transected and removed.  Given the continued ooze the pneumoperitoneum was released to 0 and 5 minutes were elapsed while assessing for any active bleeding.  No active bleeding was noted.  Additional suction irrigation was done, Arista was placed over the raw surfaces and a JP drain was placed through the left sided robotic port.  The robotic portion was completed. The robot was undocked.   By standard laparoscopy the pelvis was evaluated and no additional active bleeding was noted.  Local anesthetic was not placed in the pelvis as I felt it would just be removed by the JP drain. The cuff was reinspected and  found to be hemostatic. All pedicles were also found to be hemostatic. Under direct visualization the ports were removed. Pneumoperitoneum was released and the skin incisions were closed with 4-0 Vicryl in a running subcuticular fashion.  The JP drain was  affixed.    Dr. Junious Silk then came and exchanged the ureteral stents under fluoroscopy.  See his note for details.  Sponge lap and needle counts were correct x3 the patient was woken from general anesthesia having tolerated the procedure well and taken to the recovery room in a stable fashion and with stable vitals. JP drain output was more than expected.   Ala Dach 08/05/2020 8:42 PM

## 2020-08-05 NOTE — Anesthesia Procedure Notes (Signed)
Procedure Name: Intubation Date/Time: 08/05/2020 7:39 AM Performed by: Genelle Bal, CRNA Pre-anesthesia Checklist: Patient identified, Emergency Drugs available, Suction available and Patient being monitored Patient Re-evaluated:Patient Re-evaluated prior to induction Oxygen Delivery Method: Circle system utilized Preoxygenation: Pre-oxygenation with 100% oxygen Induction Type: IV induction Ventilation: Mask ventilation without difficulty Laryngoscope Size: Miller and 2 Grade View: Grade I Tube type: Oral Tube size: 7.0 mm Number of attempts: 1 Airway Equipment and Method: Stylet Placement Confirmation: ETT inserted through vocal cords under direct vision,  positive ETCO2 and breath sounds checked- equal and bilateral Secured at: 21 cm Tube secured with: Tape Dental Injury: Teeth and Oropharynx as per pre-operative assessment

## 2020-08-05 NOTE — Op Note (Signed)
08/05/2020  11:08 AM  PATIENT:  [name]  45 y.o. female  Patient Care Team: Patient, No Pcp Per as PCP - General (General Practice)  PRE-OPERATIVE DIAGNOSIS:  Uterine Fibroids, Pelvic Pain  POST-OPERATIVE DIAGNOSIS:  Uterine Fibroids, Pelvic Pain  PROCEDURE:   XI ROBOTIC ASSISTED LYSIS OF ADHESIONS AND RIGID PROCTOSCOPY    Surgeon(s): Aloha Gell, MD Armandina Stammer, DO Leighton Ruff, MD Festus Aloe, MD   ANESTHESIA:   general  EBL:  Total I/O In: 1000 [I.V.:1000] Out: 250 [Blood:250]   SPECIMEN:  No Specimen  DISPOSITION OF SPECIMEN:  N/A  COUNTS:  YES  PLAN OF CARE:  per primary team  PATIENT DISPOSITION:  PACU - hemodynamically stable.  INDICATION: I was called to the operating room for an intraoperative consult due to significant pelvic adhesions during hysterectomy.   OR FINDINGS: Significant pelvic adhesions.  No definitive injury to the rectum noted.  Rectum appears well perfused.  DESCRIPTION: I was called to the OR by Dr. Pamala Hurry for evaluation of pelvic adhesions during hysterectomy.  Patient was asleep and dissection had been completed throughout the right side of the pelvis and some of the left.  There were significant adhesions to the left side of the pelvis and she was concerned about damage to the surrounding structures.  I took over the robotic console and evaluated the pelvis.  The right ovary could be identified.  The sigmoid colon was noted in the pelvis and appeared to be without injury.  The rectum appeared to be separated from the backside of the uterus and was lying in the posterior pelvis.  The left ureter could not be identified.  I began by bluntly mobilizing the adhesions on the left side and dividing only adhesions that could be visualized completely.  I attempted to identify the left ureter in its normal anatomic position but I could only find the left iliac.  There was some tissue mobilized medial to this.  I evaluated this and  there was a tubular structure that appeared to vermiculate.  This was felt to be the ureter.  I dissected this as much off of the uterus as I could and then turned the case over to the urology team for further evaluation to avoid injury to the structure.  I then performed a rigid proctoscopy to evaluate the rectum.  The rectum insufflated without difficulty and there was no perforation or injury noted upon insufflation under irrigation.  The rectal mucosa appeared well perfused and without ischemic injury.  There were no defects in the rectal wall.  I discussed the anatomy with the operating surgeon and turned the case over to her and the urology team.

## 2020-08-05 NOTE — Anesthesia Postprocedure Evaluation (Signed)
Anesthesia Post Note  Patient: [name]  Procedure(s) Performed: XI ROBOTIC ASSISTED LAPAROSCOPIC HYSTERECTOMY AND SALPINGECTOMY (Bilateral Abdomen) CYSTOSCOPY/RETROGRADE/URETEROSCOPY AND BILATERAL STENT PLACWMENT (Bladder) PROCTOSCOPY (Rectum)     Patient location during evaluation: PACU Anesthesia Type: General Level of consciousness: sedated and responds to stimulation Pain management: pain level controlled Vital Signs Assessment: post-procedure vital signs reviewed and stable Respiratory status: spontaneous breathing, nonlabored ventilation, respiratory function stable and patient connected to face mask oxygen Cardiovascular status: blood pressure returned to baseline and stable Postop Assessment: no apparent nausea or vomiting Anesthetic complications: no Comments: Pt still sleepy but arousable. Having large volume bloody output from drain- plan to return to OR for second look. She is hemodynamically stable in recovery.   No complications documented.  Last Vitals:  Vitals:   08/05/20 1500 08/05/20 1515  BP: (!) 153/91 (!) 146/95  Pulse: 73 76  Resp: 14 16  Temp:    SpO2: 100% 100%    Last Pain:  Vitals:   08/05/20 1509  TempSrc:   PainSc: 7                  Lidia Collum

## 2020-08-05 NOTE — Brief Op Note (Signed)
08/05/2020  4:21 PM  PATIENT:  [name]  46 y.o. female  PRE-OPERATIVE DIAGNOSIS:  Uterine Fibroids,   POST-OPERATIVE DIAGNOSIS:  Uterine Fibroids,   PROCEDURE:  Bilateral Robotic Ureterolysis  SURGEON:  Surgeon(s) and Role:      Alexis Frock, MD - Assisting    ASSISTANTS: Festus Aloe MD   ANESTHESIA:   general  EBL:  350 mL   BLOOD ADMINISTERED:none  DRAINS: as per GYN notes   LOCAL MEDICATIONS USED:  OTHER as per GYN notes  SPECIMEN:  Source of Specimen:  as per GYN notes  DISPOSITION OF SPECIMEN:  PATHOLOGY  COUNTS:  YES  TOURNIQUET:  * No tourniquets in log *  DICTATION: .Other Dictation: Dictation Number  [id number]  PLAN OF CARE: Admit to inpatient   PATIENT DISPOSITION:  PACU - hemodynamically stable.   Delay start of Pharmacological VTE agent (>24hrs) due to surgical blood loss or risk of bleeding: not applicable

## 2020-08-05 NOTE — Progress Notes (Addendum)
Postop day #0  Review of events:  Patient presented for routine robotic assisted total laparoscopic hysterectomy with bilateral salpingectomy and ovarian retention for fibroid uterus with menorrhagia and anemia.  Intra-Op findings consistent with stage IV endometriosis which caused prolonged surgery needing lysis of adhesions, enterolysis, ureterolysis, proctoscopy, cystoscopy with bilateral ureteral stent placement in order to accomplish the robotic assisted total laparoscopic hysterectomy with bilateral salpingectomy.  A JP drain was placed in the pelvis at the end of the case both to monitor for surgical bleeding and for the possibility of delayed bowel injury due to significant denuding of the serosa of the rectosigmoid.  Surgical time was about 6 and half hours with 350 cc blood loss.  Patient remained hemodynamically stable through the case.  For the first 30 minutes after patient was in the PACU JP drain output was about 150 cc.  It was unclear how much of this drainage was Intra-Op loss that was only collected after patient was removed from her Trendelenburg position versus continued ooze from the denuded surfaces versus active surgical bleeding.  Type and cross was initiated but blood was not transfused.  Patient was given 1000 mg of IV transexamined acid and labs were checked.  Hemoglobin had dropped from 14 to 10 and platelets had dropped from 240s to 160.  Patient was continuing to have good urine output.  There were no findings consistent with DIC.   Decision was made to continue close observation.  Over the next hour JP out put was 100 cc but vitals remained stable with good urine output.  The next postoperative hour again had 100 cc sanguinous output from the [place].  Consideration was given to taking her back to the OR for exploration but the most likely cause of the bleeding was broad serosal ooze from the denuded tissue.  At this time the JP output started slowing to about 15 cc over 30  minutes.  Decision was made to transfer the patient to the floor with telemetry and frequent assessment of vital signs and JP and Foley output.  At time of this note patient has been moved to telemetry floor and IV fluids and vitals and urine and JP output are just being initiated by nursing staff.  Patient reports abdominal pain controlled with IV pain meds.  Patient notes occasional pressure when taking a deep breath.  Patient denies chest pain.  Patient denies nausea.  Patient mostly reports feeling sleepy.  O:  Vitals:   08/05/20 1730 08/05/20 1745 08/05/20 1800 08/05/20 1823  BP: 140/85 (!) 160/92 (!) 156/90 (!) 155/91  Pulse: 69 72 79 78  Resp: 17 (!) 21 19 16   Temp:    98.9 F (37.2 C)  TempSrc:    Oral  SpO2: 100% 100% 100% 99%  Weight:      Height:       Per current telemetry pulse 73, O2 sat 99 on blood pressure in the room 140/80.  Vital signs not crossing into epic system.  General: Sleepy but arouses and able to carry on conversation Abdomen: Soft, appropriately tender Incision: Clean and covered with Dermabond.  Left JP drain with serosanguineous discharge on dressing that has not increased from PACU GU: Foley draining pink urine.  No vaginal bleeding no staining on the pad Lower extremity: Nontender, no edema   CBC Latest Ref Rng & Units 08/05/2020 08/05/2020 08/05/2020  WBC 4.0 - 10.5 K/uL Janet.7(H) 14.5(H) -  Hemoglobin Janet.0 - 15.0 g/dL 10.2(L) 10.9(L) -  Hematocrit 36.0 -  46.0 % 30.1(L) 33.2(L) -  Platelets 150 - 400 K/uL 166 164 163   Assessment and plan: 45 year old Hayes, [name] postop day 0 from robotic assisted total laparoscopic hysterectomy with bilateral salpingectomy, enterolysis with proctoscopy and JP drain placement, ureterolysis with cystoscopy and ureteral stent placements due to significant adhesions from stage IV endometriosis. -Postop bleeding.  JP drain output over the first 2-1/2 hours postop was more than expected but has slowed significantly.  Patient  remains hemodynamically stable with stable hemoglobin.  Will repeat another CBC 4 hours from her last and again in the morning.  Will play close attention to urine output, JP drain output and vital signs. -IV pain meds as needed for now.  Antiemetics and antacids as needed -Foley catheter in place with ureteral stents.  Plan to remove stents in 3 to 6 weeks per urology.  We will plan Macrobid 50 megs nightly for prophylaxis and consider oxybutynin for antispasmodic -Patient has been n.p.o.  Will allow small clears and reevaluate in the a.m. -IV fluids.  Normal saline at 125 cc an hour - SCDs  Ala Dach 08/05/2020 8:22 PM   Of note 2 hours of critical care time was spent at the bedside.  I was evaluating blood loss, vital signs, ordering labs and medications as well as consulting with nurses, on-call gynecologist, general surgery and urology.  I was keeping patient and husband updated to the events and the plans as they changed.  Ala Dach 08/08/2020 1:03 PM

## 2020-08-05 NOTE — Discharge Instructions (Signed)
Ureteral Stent Implantation, Care After This sheet gives you information about how to care for yourself after your procedure. Your health care provider may also give you more specific instructions. If you have problems or questions, contact your health care provider.  **Be sure to call Dr. Junious Silk [facility] [phone]) and plan follow-up for ureteral stent removal.**  What can I expect after the procedure? After the procedure, it is common to have:  Nausea.  Mild pain when you urinate. You may feel this pain in your lower back or lower abdomen. The pain should stop within a few minutes after you urinate. This may last for up to 1 week.  A small amount of blood in your urine for several days. Follow these instructions at home: Medicines  Take over-the-counter and prescription medicines only as told by your health care provider.  If you were prescribed an antibiotic medicine, take it as told by your health care provider. Do not stop taking the antibiotic even if you start to feel better.  Do not drive for 24 hours if you were given a sedative during your procedure.  Ask your health care provider if the medicine prescribed to you requires you to avoid driving or using heavy machinery. Activity  Rest as told by your health care provider.  Avoid sitting for a long time without moving. Get up to take short walks every 1-2 hours. This is important to improve blood flow and breathing. Ask for help if you feel weak or unsteady.  Return to your normal activities as told by your health care provider. Ask your health care provider what activities are safe for you. General instructions  Watch for any blood in your urine. Call your health care provider if the amount of blood in your urine increases.  If you have a catheter: ? Follow instructions from your health care provider about taking care of your catheter and collection bag. ? Do not take baths, swim, or use a hot tub until your  health care provider approves. Ask your health care provider if you may take showers. You may only be allowed to take sponge baths.  Drink enough fluid to keep your urine pale yellow.  Do not use any products that contain nicotine or tobacco, such as cigarettes, e-cigarettes, and chewing tobacco. These can delay healing after surgery. If you need help quitting, ask your health care provider.  Keep all follow-up visits as told by your health care provider. This is important.   Contact a health care provider if:  You have pain that gets worse or does not get better with medicine, especially pain when you urinate.  You have difficulty urinating.  You feel nauseous or you vomit repeatedly during a period of more than 2 days after the procedure. Get help right away if:  Your urine is dark red or has blood clots in it.  You are leaking urine (have incontinence).  The end of the stent comes out of your urethra.  You cannot urinate.  You have sudden, sharp, or severe pain in your abdomen or lower back.  You have a fever.  You have swelling or pain in your legs.  You have difficulty breathing. Summary  After the procedure, it is common to have mild pain when you urinate that goes away within a few minutes after you urinate. This may last for up to 1 week.  Watch for any blood in your urine. Call your health care provider if the amount of blood in  your urine increases.  Take over-the-counter and prescription medicines only as told by your health care provider.  Drink enough fluid to keep your urine pale yellow. This information is not intended to replace advice given to you by your health care provider. Make sure you discuss any questions you have with your health care provider. Document Revised: 03/04/2018 Document Reviewed: 03/05/2018 Elsevier Patient Education  2021 [employer].

## 2020-08-05 NOTE — Op Note (Signed)
08/05/2020  3:21 PM  PATIENT:  [name]  45 y.o. female  PRE-OPERATIVE DIAGNOSIS:  Uterine Fibroids, menorrhagia, iron deficiency anemia, suspected endometriosis  POST-OPERATIVE DIAGNOSIS:  Uterine Fibroids, menorrhagia, iron deficiency anemia, stage IV endometriosis with significant adhesions  PROCEDURE:  Procedure(s) with comments: XI ROBOTIC ASSISTED LAPAROSCOPIC HYSTERECTOMY AND SALPINGECTOMY (Bilateral) CYSTOSCOPY/RETROGRADE/URETEROSCOPY AND BILATERAL STENT PLACWMENT - DR ESKRIDGE PROCTOSCOPY - DR Marcello Moores  Ureterolysis, enterolysis, lysis of adhesions  SURGEON:  Surgeon(s) and Role:    * Aloha Gell, MD - Primary    * Festus Aloe, MD - Assisting    * Alexis Frock, MD - Assisting    * Leighton Ruff, MD - Assisting    * Law, Cassandra A, DO - Assisting  PHYSICIAN ASSISTANT: As above  ASSISTANTS: As above  ANESTHESIA:   general EBL:  350 mL   BLOOD ADMINISTERED:none  DRAINS: (1) Jackson-Pratt drain(s) with closed bulb suction in the Pelvis and Urinary Catheter (Foley)   LOCAL MEDICATIONS USED:  NONE  SPECIMEN: Uterus, cervix, bilateral fallopian tubes  DISPOSITION OF SPECIMEN:  PATHOLOGY  COUNTS:  YES  TOURNIQUET:  * No tourniquets in log *  DICTATION: .Dragon Dictation  PLAN OF CARE: Admit to inpatient   PATIENT DISPOSITION:  PACU - hemodynamically stable.   Delay start of Pharmacological VTE agent (>24hrs) due to surgical blood loss or risk of bleeding: yes    Ala Dach 08/05/2020 3:29 PM

## 2020-08-05 NOTE — H&P (Signed)
Chief complaint: Fibroid uterus  History of present illness: 45 year old G1 P1 nonpregnant with longstanding history of anemia and bulky fibroids presents for hysterectomy with bilateral salpingectomy.  Patient was originally planning surgery back in 2020 though this was canceled due to the Covid pandemic and patient has finally rescheduled.  Patient does have a history of iron deficiency anemia due to menorrhagia with hemoglobin down to 8.  This has improved over the past 4 months with IV iron x2 and Depo-Lupron injections.  Patient does report light menses earlier this week for just a few days.  Patient also with suspicion for endometriosis given prior ultrasound findings of endometrioma.  She has been on hormonal suppression since 2018  Pap smear February 22 normal.  Endometrial biopsy attempted but unsuccessful due to cervical stenosis.  Ultrasound February 22 uterus 10 x 10 x 7 cm, 275 cm with 8 mm endometrial stripe and multiple fibroids  Past Medical History:  Diagnosis Date  . Acute blood loss anemia 04/08/2011  . AMA (advanced maternal age) multigravida 46+   . No pertinent past medical history   . Uterine fibroid    Past Surgical History:  Procedure Laterality Date  . EPIDURAL BLOCK INJECTION      No known drug allergies Physical exam Today's Vitals   07/26/20 1511 08/05/20 0612  BP:  (!) 151/98  Pulse:  72  Resp:  14  Temp:  97.6 F (36.4 C)  TempSrc:  Oral  SpO2:  99%  Weight: 51.3 kg 52.4 kg  Height: 5\' 2"  (1.575 m) 5\' 2"  (1.575 m)  PainSc:  0-No pain   Body mass index is 21.14 kg/m. General: Well-appearing, no distress Abdomen: Soft, nontender GU: Deferred to the OR Lower extremity: Nontender no edema  CBC    Component Value Date/Time   WBC 2.8 (L) 08/05/2020 0623   RBC 4.64 08/05/2020 0623   HGB 14.2 08/05/2020 0623   HCT 43.2 08/05/2020 0623   PLT 235 08/05/2020 0623   MCV 93.1 08/05/2020 0623   MCH 30.6 08/05/2020 0623   MCHC 32.9 08/05/2020 0623    RDW 12.9 08/05/2020 0623    Assessment plan: 45 year old G1 P1, nonpregnant with menorrhagia and anemia fibroid uterus.  Patient's been on suppression for the past several months with Lupron which has improved her symptoms and decreased the volume of her uterus from 335 to 275 cc.  Risk benefits of surgery discussed with patient who agrees to proceed with robotic assisted total laparoscopic hysterectomy and bilateral salpingectomy.  Ala Dach 08/05/2020 7:25 AM

## 2020-08-05 NOTE — Brief Op Note (Signed)
08/05/2020  3:21 PM  PATIENT:  [name]  45 y.o. female  PRE-OPERATIVE DIAGNOSIS:  Uterine Fibroids, menorrhagia, iron deficiency anemia, suspected endometriosis  POST-OPERATIVE DIAGNOSIS:  Uterine Fibroids, menorrhagia, iron deficiency anemia, stage IV endometriosis with significant adhesions  PROCEDURE:  Procedure(s) with comments: XI ROBOTIC ASSISTED LAPAROSCOPIC HYSTERECTOMY AND SALPINGECTOMY (Bilateral) CYSTOSCOPY/RETROGRADE/URETEROSCOPY AND BILATERAL STENT PLACWMENT - DR ESKRIDGE PROCTOSCOPY - DR Marcello Moores  Ureterolysis, enterolysis, lysis of adhesions  SURGEON:  Surgeon(s) and Role:    * Aloha Gell, MD - Primary    * Festus Aloe, MD - Assisting    * Alexis Frock, MD - Assisting    * Leighton Ruff, MD - Assisting    * Law, Cassandra A, DO - Assisting  PHYSICIAN ASSISTANT: As above  ASSISTANTS: As above  ANESTHESIA:   general EBL:  350 mL   BLOOD ADMINISTERED:none  DRAINS: (1) Jackson-Pratt drain(s) with closed bulb suction in the Pelvis and Urinary Catheter (Foley)   LOCAL MEDICATIONS USED:  NONE  SPECIMEN: Uterus, cervix, bilateral fallopian tubes  DISPOSITION OF SPECIMEN:  PATHOLOGY  COUNTS:  YES  TOURNIQUET:  * No tourniquets in log *  DICTATION: .Dragon Dictation  PLAN OF CARE: Admit to inpatient   PATIENT DISPOSITION:  PACU - hemodynamically stable.   Delay start of Pharmacological VTE agent (>24hrs) due to surgical blood loss or risk of bleeding: yes

## 2020-08-05 NOTE — Anesthesia Preprocedure Evaluation (Signed)
Anesthesia Evaluation  Patient identified by MRN, date of birth, ID band Patient awake    Reviewed: Allergy & Precautions, NPO status , Patient's Chart, lab work & pertinent test results  History of Anesthesia Complications Negative for: history of anesthetic complications  Airway Mallampati: II  TM Distance: >3 FB Neck ROM: Full    Dental  (+) Teeth Intact   Pulmonary neg pulmonary ROS, former smoker,    Pulmonary exam normal        Cardiovascular negative cardio ROS Normal cardiovascular exam     Neuro/Psych negative neurological ROS  negative psych ROS   GI/Hepatic negative GI ROS, Neg liver ROS,   Endo/Other  negative endocrine ROS  Renal/GU negative Renal ROS  negative genitourinary   Musculoskeletal negative musculoskeletal ROS (+)   Abdominal   Peds  Hematology negative hematology ROS (+)   Anesthesia Other Findings   Reproductive/Obstetrics Uterine fibroids                            Anesthesia Physical Anesthesia Plan  ASA: II  Anesthesia Plan: General   Post-op Pain Management:    Induction: Intravenous  PONV Risk Score and Plan: 4 or greater and Ondansetron, Dexamethasone, Treatment may vary due to age or medical condition and Midazolam  Airway Management Planned: Oral ETT  Additional Equipment: None  Intra-op Plan:   Post-operative Plan: Extubation in OR  Informed Consent: I have reviewed the patients History and Physical, chart, labs and discussed the procedure including the risks, benefits and alternatives for the proposed anesthesia with the patient or authorized representative who has indicated his/her understanding and acceptance.     Dental advisory given  Plan Discussed with:   Anesthesia Plan Comments:         Anesthesia Quick Evaluation

## 2020-08-05 NOTE — Op Note (Signed)
Preoperative diagnosis: Uterine fibroids, gross hematuria Postoperative diagnosis: Same  Procedure: Cystoscopy, bilateral retrograde pyelogram, bilateral ureteral stent placement  Surgeon: Junious Silk  Anesthesia: General  Indication for procedure: Intra-Op consult for cystoscopy with retrograde and stent placement.  Dr. Valentino Saxon performing robotic hysterectomy.  Ureteral and pelvic anatomy needed to be defined.  With stent placement and traction to remove the uterus there was gross hematuria by the end of the case.  No clots.  Findings: Initial cystoscopy noted the trigone to be normal and the ureteral orifice ease in their normal orthotopic position.  Clear efflux was noted bilaterally.  Left retrograde pyelogram-this outlined a single ureter single collecting system unit without filling defect, stricture or dilation.  Right retrograde pyelogram-this outlined a single ureter single collecting system unit without filling defect, stricture or dilation.  At the end of the procedure, final cystoscopy the bladder appeared normal.  There were no mucosal lesions or tears.  The bladder held fluid normally with equal return.  No stitch or foreign body in the bladder.  Trigone was noted to be normal with good stent coil emanating from the right ureteral orifice and the left ureteral orifice.  Description of procedure: Intra-Op consult.  Patient intubated in OR with robot docked.  Pelvic anatomy identified by Dr. Valentino Saxon.  I performed initial cystoscopy with findings noted above.  The left ureteral orifice was cannulated with a 5 [place] open-ended catheter left retrograde injection of firefly was performed.  Wire was advanced to the collecting system without difficulty.  5 [place] open-ended catheter advanced over the wire and left in the ureter at about 20 cm.  Similarly right ureteral orifice cannulated with a 6 [place] open-ended catheter and retrograde injection of firefly was done.  Wire was advanced into  the collecting system without difficulty and 6 [place] open-ended catheter was advanced without difficulty and left at about 20 cm.  The scope was backed out.  Foley catheter was replaced and left to gravity drainage.  Stents were secured to the Foley.  This initial stent placement was done without fluoroscopy.  Dr. Tresa Moore entered the room and completed the ureterolysis.  Ureters were readily identifiable after the firefly and with the ureteral stents in place.  Right ureter had extensive ureterolysis and therefore plan to return and swap out the external stents to double-J stents.  Dr. Valentino Saxon completed her procedure.  Robot undocked and all incisions closed.  With fluoroscopic guidance left retrograde was performed by injecting contrast up the existing left ureteral stent which nicely outlined the collecting system and proximal ureter.  Zip wire was advanced and coiled in the collecting system and the wire backed out.  Wire was secured to the drape.  Similarly right retrograde injection of contrast was performed with findings above which appeared normal.  Sensor wire was advanced in the open-ended catheter backed out.  Sensor wire was secured.  Foley catheter was then removed.  The sensor wire was then backloaded on the cystoscope and a 6 x 24 cm stent advanced up the right side.  The wire was removed with a good coil seen in the kidney and a good coil in the bladder cystoscopically.  Next the zip wire was backloaded on the cystoscope and a 6 x 24 cm stent advanced up the left side.  Wire was removed with a good coil seen in the kidney and a good coil in the bladder.  Final cystoscopic evaluation of the bladder was performed with above findings.  The scope was backed out  and a 55 French Foley catheter placed in left to gravity drainage.  She was then awakened and taken to recovery room in stable condition.   Complications: None  Blood loss: Minimal  Specimens: None  Drains: Bilateral 6 x 24 cm ureteral  stents, 16 French Foley catheter  Disposition: Patient stable to PACU

## 2020-08-05 NOTE — Transfer of Care (Signed)
Immediate Anesthesia Transfer of Care Note  Patient: [name]  Procedure(s) Performed: XI ROBOTIC ASSISTED LAPAROSCOPIC HYSTERECTOMY AND SALPINGECTOMY (Bilateral Abdomen) CYSTOSCOPY/RETROGRADE/URETEROSCOPY AND BILATERAL STENT PLACWMENT (Bladder) PROCTOSCOPY (Rectum)  Patient Location: PACU  Anesthesia Type:General  Level of Consciousness: awake, alert  and oriented  Airway & Oxygen Therapy: Patient Spontanous Breathing and Patient connected to face mask oxygen  Post-op Assessment: Report given to RN and Post -op Vital signs reviewed and stable  Post vital signs: Reviewed and stable  Last Vitals:  Vitals Value Taken Time  BP 154/83 08/05/20 1405  Temp    Pulse 82 08/05/20 1406  Resp 16 08/05/20 1406  SpO2 100 % 08/05/20 1406  Vitals shown include unvalidated device data.  Last Pain:  Vitals:   08/05/20 0612  TempSrc: Oral  PainSc: 0-No pain      Patients Stated Pain Goal: 3 (09/90/68 9340)  Complications: No complications documented.

## 2020-08-06 LAB — CBC
HCT: 27.8 % — ABNORMAL LOW (ref 36.0–46.0)
Hemoglobin: 9.2 g/dL — ABNORMAL LOW (ref 12.0–15.0)
MCH: 31.3 pg (ref 26.0–34.0)
MCHC: 33.1 g/dL (ref 30.0–36.0)
MCV: 94.6 fL (ref 80.0–100.0)
Platelets: 155 10*3/uL (ref 150–400)
RBC: 2.94 MIL/uL — ABNORMAL LOW (ref 3.87–5.11)
RDW: 12.8 % (ref 11.5–15.5)
WBC: 9 10*3/uL (ref 4.0–10.5)
nRBC: 0 % (ref 0.0–0.2)

## 2020-08-06 MED ORDER — BISACODYL 5 MG PO TBEC
10.0000 mg | DELAYED_RELEASE_TABLET | Freq: Every day | ORAL | Status: DC
  Administered 2020-08-06: 10 mg via ORAL
  Filled 2020-08-06: qty 2

## 2020-08-06 MED ORDER — NITROFURANTOIN MONOHYD MACRO 100 MG PO CAPS
100.0000 mg | ORAL_CAPSULE | Freq: Every day | ORAL | Status: DC
  Administered 2020-08-06: 100 mg via ORAL
  Filled 2020-08-06: qty 1

## 2020-08-06 MED ORDER — ACETAMINOPHEN 325 MG PO TABS
650.0000 mg | ORAL_TABLET | Freq: Four times a day (QID) | ORAL | Status: DC
  Administered 2020-08-06 – 2020-08-07 (×5): 650 mg via ORAL
  Filled 2020-08-06 (×5): qty 2

## 2020-08-06 NOTE — Consult Note (Addendum)
Consultation: Ureteral identification, ureterolysis Requested by: Dr. Larina Earthly  History of Present Illness: [name] is a 45 year old female who underwent robot-assisted laparoscopic hysterectomy and urologic consult was requested Intra-Op for ureteral identification.  I performed cystoscopy with retrograde firefly injection and ureteral stent placement.  Dr. Barbee Cough performed robotic ureterolysis.  The external stents were converted to double-J stents at the end of the case due to extensive bilateral ureterolysis.  Patient is doing well this morning, but hasn't ambulated. She is hungry. Creatinine was 0.68 yesterday evening.  Hemoglobin 9.2 today.  Dr. Pamala Hurry recalls pt had prior h/o urinary retention p a delivery but this was associated with an epidural.   Past Medical History:  Diagnosis Date  . Acute blood loss anemia 04/08/2011  . AMA (advanced maternal age) multigravida 31+   . No pertinent past medical history   . Uterine fibroid    Past Surgical History:  Procedure Laterality Date  . EPIDURAL BLOCK INJECTION      Home Medications:  Medications Prior to Admission  Medication Sig Dispense Refill Last Dose  . Multiple Vitamins-Minerals (MULTIVITAMIN WITH MINERALS) tablet Take 1 tablet by mouth daily.   Past Month at Unknown time  . UNABLE TO FIND Med Name: lupron injectio jan 2022   Past Month at Unknown time   Allergies: No Known Allergies  Family History  Problem Relation Age of Onset  . Hypertension Mother    Social History:  reports that she has quit smoking. Her smoking use included cigarettes. She has a 5.20 pack-year smoking history. She has never used smokeless tobacco. She reports current alcohol use. She reports that she does not use drugs.  ROS: A complete review of systems was performed.  All systems are negative except for pertinent findings as noted. Review of Systems  All other systems reviewed and are negative.    Physical Exam:  Vital signs in  last 24 hours: Temp:  [98.2 F (36.8 C)-99 F (37.2 C)] 98.8 F (37.1 C) (02/26 0840) Pulse Rate:  [69-83] 74 (02/26 0840) Resp:  [12-21] 16 (02/26 0729) BP: (117-160)/(77-99) 117/77 (02/26 0840) SpO2:  [92 %-100 %] 92 % (02/26 0840) General:  Alert and oriented, No acute distress Foley in place - urine clear with some blood settling in the tubing. I manipualted the tubing and she drained about 200 ml very light amber urine.   Laboratory Data:  Results for orders placed or performed during the hospital encounter of 08/05/20 (from the past 24 hour(s))  Prepare RBC (crossmatch)     Status: None   Collection Time: 08/05/20  3:18 PM  Result Value Ref Range   Order Confirmation      ORDER PROCESSED BY BLOOD BANK Performed at [facility], [place] [address] [place], [place] [address]   DIC Panel ONCE - STAT     Status: Abnormal   Collection Time: 08/05/20  3:18 PM  Result Value Ref Range   Prothrombin Time 14.9 11.4 - 15.2 seconds   INR 1.2 0.8 - 1.2   aPTT 32 24 - 36 seconds   Fibrinogen 153 (L) 210 - 475 mg/dL   D-Dimer, Quant 3.79 (H) 0.00 - 0.50 ug/mL-FEU   Platelets 163 150 - 400 K/uL   Smear Review NO SCHISTOCYTES SEEN   CBC     Status: Abnormal   Collection Time: 08/05/20  3:20 PM  Result Value Ref Range   WBC 14.5 (H) 4.0 - 10.5 K/uL   RBC 3.49 (L) 3.87 -  5.11 MIL/uL   Hemoglobin 10.9 (L) 12.0 - 15.0 g/dL   HCT 33.2 (L) 36.0 - 46.0 %   MCV 95.1 80.0 - 100.0 fL   MCH 31.2 26.0 - 34.0 pg   MCHC 32.8 30.0 - 36.0 g/dL   RDW 12.8 11.5 - 15.5 %   Platelets 164 150 - 400 K/uL   nRBC 0.0 0.0 - 0.2 %  CBC with Differential/Platelet     Status: Abnormal   Collection Time: 08/05/20  6:47 PM  Result Value Ref Range   WBC 12.7 (H) 4.0 - 10.5 K/uL   RBC 3.24 (L) 3.87 - 5.11 MIL/uL   Hemoglobin 10.2 (L) 12.0 - 15.0 g/dL   HCT 30.1 (L) 36.0 - 46.0 %   MCV 92.9 80.0 - 100.0 fL   MCH 31.5 26.0 - 34.0 pg   MCHC 33.9 30.0 - 36.0 g/dL   RDW 12.6 11.5 - 15.5 %    Platelets 166 150 - 400 K/uL   nRBC 0.0 0.0 - 0.2 %   Neutrophils Relative % 94 %   Neutro Abs 11.8 (H) 1.7 - 7.7 K/uL   Lymphocytes Relative 2 %   Lymphs Abs 0.3 (L) 0.7 - 4.0 K/uL   Monocytes Relative 4 %   Monocytes Absolute 0.5 0.1 - 1.0 K/uL   Eosinophils Relative 0 %   Eosinophils Absolute 0.0 0.0 - 0.5 K/uL   Basophils Relative 0 %   Basophils Absolute 0.0 0.0 - 0.1 K/uL   Immature Granulocytes 0 %   Abs Immature Granulocytes 0.04 0.00 - 0.07 K/uL  Basic metabolic panel     Status: Abnormal   Collection Time: 08/05/20  6:47 PM  Result Value Ref Range   Sodium 138 135 - 145 mmol/L   Potassium 3.7 3.5 - 5.1 mmol/L   Chloride 106 98 - 111 mmol/L   CO2 24 22 - 32 mmol/L   Glucose, Bld 128 (H) 70 - 99 mg/dL   BUN 10 6 - 20 mg/dL   Creatinine, Ser 0.68 0.44 - 1.00 mg/dL   Calcium 8.3 (L) 8.9 - 10.3 mg/dL   GFR, Estimated >60 >60 mL/min   Anion gap 8 5 - 15  CBC with Differential/Platelet     Status: Abnormal   Collection Time: 08/05/20 10:10 PM  Result Value Ref Range   WBC 11.4 (H) 4.0 - 10.5 K/uL   RBC 3.14 (L) 3.87 - 5.11 MIL/uL   Hemoglobin 9.8 (L) 12.0 - 15.0 g/dL   HCT 29.8 (L) 36.0 - 46.0 %   MCV 94.9 80.0 - 100.0 fL   MCH 31.2 26.0 - 34.0 pg   MCHC 32.9 30.0 - 36.0 g/dL   RDW 12.6 11.5 - 15.5 %   Platelets 172 150 - 400 K/uL   nRBC 0.0 0.0 - 0.2 %   Neutrophils Relative % 92 %   Neutro Abs 10.4 (H) 1.7 - 7.7 K/uL   Lymphocytes Relative 3 %   Lymphs Abs 0.4 (L) 0.7 - 4.0 K/uL   Monocytes Relative 5 %   Monocytes Absolute 0.5 0.1 - 1.0 K/uL   Eosinophils Relative 0 %   Eosinophils Absolute 0.0 0.0 - 0.5 K/uL   Basophils Relative 0 %   Basophils Absolute 0.0 0.0 - 0.1 K/uL   Immature Granulocytes 0 %   Abs Immature Granulocytes 0.03 0.00 - 0.07 K/uL  CBC     Status: Abnormal   Collection Time: 08/06/20  4:43 AM  Result Value Ref Range   WBC  9.0 4.0 - 10.5 K/uL   RBC 2.94 (L) 3.87 - 5.11 MIL/uL   Hemoglobin 9.2 (L) 12.0 - 15.0 g/dL   HCT 27.8 (L) 36.0 -  46.0 %   MCV 94.6 80.0 - 100.0 fL   MCH 31.3 26.0 - 34.0 pg   MCHC 33.1 30.0 - 36.0 g/dL   RDW 12.8 11.5 - 15.5 %   Platelets 155 150 - 400 K/uL   nRBC 0.0 0.0 - 0.2 %   Recent Results (from the past 240 hour(s))  SARS CORONAVIRUS 2 (TAT 6-24 HRS) Nasopharyngeal Nasopharyngeal Swab     Status: None   Collection Time: 08/02/20 10:56 AM   Specimen: Nasopharyngeal Swab  Result Value Ref Range Status   SARS Coronavirus 2 NEGATIVE NEGATIVE Final    Comment: (NOTE) SARS-CoV-2 target nucleic acids are NOT DETECTED.  The SARS-CoV-2 RNA is generally detectable in upper and lower respiratory specimens during the acute phase of infection. Negative results do not preclude SARS-CoV-2 infection, do not rule out co-infections with other pathogens, and should not be used as the sole basis for treatment or other patient management decisions. Negative results must be combined with clinical observations, patient history, and epidemiological information. The expected result is Negative.  Fact Sheet for Patients: SugarRoll.be  Fact Sheet for Healthcare Providers: https://www.woods-mathews.com/  This test is not yet approved or cleared by the [place] FDA and  has been authorized for detection and/or diagnosis of SARS-CoV-2 by FDA under an Emergency Use Authorization (EUA). This EUA will remain  in effect (meaning this test can be used) for the duration of the COVID-19 declaration under Se ction 564(b)(1) of the Act, 21 U.S.C. section 360bbb-3(b)(1), unless the authorization is terminated or revoked sooner.  Performed at [facility], [place] [address], [place], [place] [address]    Creatinine: Recent Labs    08/05/20 1847  CREATININE 0.68    Impression/Assessment/plan:  Uterine fibroids, menorrhagia, suspected endometriosis requiring ureterolysis and ureteral stent placement during robot-assisted laparoscopic hysterectomy and  salpingectomy; gross hematuria - hematuria resolved and was mild at end of case yesterday. Hematuria expected with stents and urterine manipulatiuon behind bladder. Final cystoscopy looked good and normal yesterday.   -We will continue ureteral stents for 2-3 weeks and target week of Mar 14 for removal. We will arrange for outpatient cystoscopic removal in office - discussed with pt and husband.  -Oxybutynin for bladder spasm, 5 mg 1-4 times daily.  Watch for constipation. -Nitrofurantoin 50 mg p.o. nightly recommended while stents in place, give a prescription for #21 which will cover for a few days after stent removal. --OK to d/c foley from GU pt of view when pt ambulatory. OK to remove JP drain if output remains low.  Discussed with Dr. Pamala Hurry on rounds this AM.      Festus Aloe 08/06/2020, 8:44 AM  \

## 2020-08-06 NOTE — Progress Notes (Signed)
Patient ambulated from bed to bedside chair where she sat for an hour. Patient then ambulated out into hall about 15 feet before she was ready to turn around and head back into room due to pain. Patient was given pain medicine for moderate surgical pain. Patient currently in bed resting. SCDS on. Patient has agreed to ambulate again this evening. Will continue to follow current treatment plan and update as necessary.

## 2020-08-06 NOTE — Progress Notes (Signed)
Postop day 1 status post assisted total laparoscopic hysterectomy with bilateral salpingectomy with significant enterolysis, ureterolysis, proctoscopy, ureteral stent placement for fibroid uterus and stage IV endometriosis  Patient notes pain controlled by IV meds. Patient asks to eat. No flatus. No chest pain or shortness of breath. Patient notes mild nausea. Patient has not been out of bed.  Physical exam: Vitals with BMI 08/06/2020 08/06/2020 08/06/2020  Height - - -  Weight - - -  BMI - - -  Systolic 882 800 349  Diastolic 77 80 85  Pulse 74 74 73   General: Well-appearing but fatigued. No distress Abdomen: Soft distention, appropriately tender, no rebound, no guarding Incision: Laparoscopic port sites closed intact with minimal bruising and covered with Dermabond. JP drain site with mild dried blood soaked on bandage. No active leaking. GU: No bleeding. Foley catheter draining pink-tinged urine Lower extremity: SCDs in place but not hooked up, no edema, nontender  CBC Latest Ref Rng & Units 08/06/2020 08/05/2020 08/05/2020  WBC 4.0 - 10.5 K/uL 9.0 11.4(H) 12.7(H)  Hemoglobin 12.0 - 15.0 g/dL 9.2(L) 9.8(L) 10.2(L)  Hematocrit 36.0 - 46.0 % 27.8(L) 29.8(L) 30.1(L)  Platelets 150 - 400 K/uL 155 172 166   Assessment and plan: 45 year old postop day 1 status post complicated robotic hysterectomy with need for enterolysis, ureterolysis and ureteral stent placement. Postop care complicated by more than expected bleeding. -Heme. Slow decline in hemoglobin though expect this is equilibration. Given stable vital signs,  good urine output, and decreasing output from the JP drain do not expect active surgical bleeding. We will continue to watch closely. Repeat CBC in the morning. Okay to move vitals to every 4 hours.  - GI. High likelihood to develop ileus. Patient with mild nausea and has Zofran as needed. Have put her on P Protonix and standing order. Patient asking to eat. No flatus yet. Will stay  on clears today and advance this afternoon as tolerated. - Renal. Stents in place. Hematuria resolving. Given history of urinary retention in the postpartum., Current hematuria uncomplicated surgery will keep Foley in until tomorrow. Remove in the a.m. and ensure patient able to void prior to discharge. Will start on daily Macrobid prophylaxis and give oxybutynin as needed as an antispasmodic at discharge. Patient will have follow-up with Dr.Eskridge as outpatient. -Postop prophylaxis. SCDs have not been hooked up and incentive spirometer has not been given. Discussed with nurse this a.m. who will initiate these measures today. Also discussed with nurse to get patient up to walk 4 times today and have her sitting in the chair later today. Continue Tylenol and ibuprofen with as needed narcotics, either IV or p.o. depending on her ability to tolerate p.o. We will change her dressing this a.m. Complicated surgery reviewed with patient and partner with details on the risks of enterolysis and ureterolysis. We discussed the rationale for the ureteral stents and the need to keep these in for several weeks to allow the ureters to heal. We also discussed the small possibility of delayed bowel injury and the need for vigilance and assessing the JP drain for possible feculent material. Dispo. Likely home tomorrow if tolerating p.o. and able to void. Will follow patient up next week in the office.  [name] 08/06/2020 10:08 AM

## 2020-08-07 LAB — CBC WITH DIFFERENTIAL/PLATELET
Abs Immature Granulocytes: 0.01 10*3/uL (ref 0.00–0.07)
Abs Immature Granulocytes: 0.02 10*3/uL (ref 0.00–0.07)
Basophils Absolute: 0 10*3/uL (ref 0.0–0.1)
Basophils Absolute: 0 10*3/uL (ref 0.0–0.1)
Basophils Relative: 0 %
Basophils Relative: 0 %
Eosinophils Absolute: 0.1 10*3/uL (ref 0.0–0.5)
Eosinophils Absolute: 0.1 10*3/uL (ref 0.0–0.5)
Eosinophils Relative: 2 %
Eosinophils Relative: 1 %
HCT: 24.8 % — ABNORMAL LOW (ref 36.0–46.0)
HCT: 25.5 % — ABNORMAL LOW (ref 36.0–46.0)
Hemoglobin: 8 g/dL — ABNORMAL LOW (ref 12.0–15.0)
Hemoglobin: 8.3 g/dL — ABNORMAL LOW (ref 12.0–15.0)
Immature Granulocytes: 0 %
Immature Granulocytes: 0 %
Lymphocytes Relative: 10 %
Lymphocytes Relative: 9 %
Lymphs Abs: 0.5 10*3/uL — ABNORMAL LOW (ref 0.7–4.0)
Lymphs Abs: 0.5 10*3/uL — ABNORMAL LOW (ref 0.7–4.0)
MCH: 31 pg (ref 26.0–34.0)
MCH: 31.4 pg (ref 26.0–34.0)
MCHC: 32.3 g/dL (ref 30.0–36.0)
MCHC: 32.5 g/dL (ref 30.0–36.0)
MCV: 96.1 fL (ref 80.0–100.0)
MCV: 96.6 fL (ref 80.0–100.0)
Monocytes Absolute: 0.3 10*3/uL (ref 0.1–1.0)
Monocytes Absolute: 0.4 10*3/uL (ref 0.1–1.0)
Monocytes Relative: 7 %
Monocytes Relative: 6 %
Neutro Abs: 3.9 10*3/uL (ref 1.7–7.7)
Neutro Abs: 4.9 10*3/uL (ref 1.7–7.7)
Neutrophils Relative %: 81 %
Neutrophils Relative %: 84 %
Platelets: 123 10*3/uL — ABNORMAL LOW (ref 150–400)
Platelets: 147 10*3/uL — ABNORMAL LOW (ref 150–400)
RBC: 2.58 MIL/uL — ABNORMAL LOW (ref 3.87–5.11)
RBC: 2.64 MIL/uL — ABNORMAL LOW (ref 3.87–5.11)
RDW: 12.8 % (ref 11.5–15.5)
RDW: 12.9 % (ref 11.5–15.5)
WBC: 4.7 10*3/uL (ref 4.0–10.5)
WBC: 5.8 10*3/uL (ref 4.0–10.5)
nRBC: 0 % (ref 0.0–0.2)
nRBC: 0 % (ref 0.0–0.2)

## 2020-08-07 LAB — BASIC METABOLIC PANEL
Anion gap: 7 (ref 5–15)
BUN: 6 mg/dL (ref 6–20)
CO2: 25 mmol/L (ref 22–32)
Calcium: 7.9 mg/dL — ABNORMAL LOW (ref 8.9–10.3)
Chloride: 107 mmol/L (ref 98–111)
Creatinine, Ser: 0.54 mg/dL (ref 0.44–1.00)
GFR, Estimated: >60 mL/min (ref >60)
Glucose, Bld: 119 mg/dL — ABNORMAL HIGH (ref 70–99)
Potassium: 3.5 mmol/L (ref 3.5–5.1)
Sodium: 139 mmol/L (ref 135–145)

## 2020-08-07 MED ORDER — ONDANSETRON HCL 4 MG PO TABS: 4.0000 mg | ORAL_TABLET | Freq: Four times a day (QID) | ORAL | 0 refills | Status: AC | PRN

## 2020-08-07 MED ORDER — IBUPROFEN 800 MG PO TABS
800.0000 mg | ORAL_TABLET | Freq: Three times a day (TID) | ORAL | 1 refills | Status: AC
Start: 2020-08-07 — End: ?

## 2020-08-07 MED ORDER — ACETAMINOPHEN 325 MG PO TABS: 650.0000 mg | ORAL_TABLET | Freq: Four times a day (QID) | ORAL | 1 refills | Status: AC

## 2020-08-07 MED ORDER — NITROFURANTOIN MONOHYD MACRO 100 MG PO CAPS: 100.0000 mg | ORAL_CAPSULE | Freq: Every day | ORAL | 1 refills | Status: AC

## 2020-08-07 MED ORDER — SODIUM CHLORIDE 0.9 % IV SOLN
510.0000 mg | Freq: Once | INTRAVENOUS | Status: AC
  Administered 2020-08-07: 510 mg via INTRAVENOUS
  Filled 2020-08-07: qty 510

## 2020-08-07 MED ORDER — BISACODYL 5 MG PO TBEC: 10.0000 mg | DELAYED_RELEASE_TABLET | Freq: Every day | ORAL | 1 refills | Status: AC

## 2020-08-07 MED ORDER — PANTOPRAZOLE SODIUM 40 MG PO TBEC
40.0000 mg | DELAYED_RELEASE_TABLET | Freq: Every day | ORAL | 1 refills | Status: AC
Start: 2020-08-08 — End: ?

## 2020-08-07 MED ORDER — OXYCODONE HCL 5 MG PO TABS: 5.0000 mg | ORAL_TABLET | ORAL | 0 refills | Status: AC | PRN

## 2020-08-07 NOTE — Progress Notes (Signed)
Pt stood up about 3 minutes by bedside,  feel dizzy  And nauseated. Assist pt to sit on the chair.

## 2020-08-07 NOTE — Progress Notes (Signed)
Patient discharged to home with family, discharge instructions reviewed with patient who verbalized understanding. Patient provided with leg bag, extra foley bag, dressing supplies, instructions for foley care. Husband instructed on changing drain bag to leg bag and how to empty drain bag. Also care of JP drain and changing dressing. Husband verbalized understanding.

## 2020-08-07 NOTE — Progress Notes (Signed)
S: pt notes pain controlled by po meds, no emesis, mild nausea and 'gas pain'. No flatus. Pt notes tolerated soft foods yesterday. Ambulated in hall, some dizziness but no 'spinning.' Able to sit in chair yesterday. Pt notes current HA. Slept OK last night. Pt notes continued blood in urine. Pt asking to go home today.   Foley out this am, awaiting void.  O:  Vitals:   08/06/20 1109 08/06/20 1452 08/06/20 2115 08/07/20 0532  BP: (!) 141/87 115/71 111/76 134/82  Pulse: 82 80 74 74  Resp: 17 15 14 14   Temp: 98.9 F (37.2 C) 98.4 F (36.9 C) 98.2 F (36.8 C) (!) 97.4 F (36.3 C)  TempSrc: Oral Oral Oral Oral  SpO2: 97% 98% 97% 98%  Weight:      Height:       I/O reviewed, adequate Uout. JP drain out markedly improving. 60cc serosanguinous in last 24 hrs.  Gen: no distress Abd: soft distension, improved from yesterday, appropriately tender.  Inc: C/D/I, dermabond in place. Left lateral incision with minimal staining on gauze around JP. GU: no bleeding.  LE: NT, no edema.   CBC Latest Ref Rng & Units 08/07/2020 08/07/2020 08/06/2020  WBC 4.0 - 10.5 K/uL 5.8 4.7 9.0  Hemoglobin 12.0 - 15.0 g/dL 8.3(L) 8.0(L) 9.2(L)  Hematocrit 36.0 - 46.0 % 25.5(L) 24.8(L) 27.8(L)  Platelets 150 - 400 K/uL 147(L) 123(L) 155   Assessment and plan: 45 year old postop day 2 status post complicated robotic hysterectomy with need for enterolysis, ureterolysis and ureteral stent placement. Postop care complicated by more than expected bleeding. -Heme. Slow decline in hemoglobin since surgery though stability this am over 4 hrs. First CBC today was drawn above IV line so results may not be accurate. Uout and Cr remain excellent.JP drain markedly improving, pt with stable vitals and clinically improving. Hgb may be falsely low due to continued IVF and dilution as well as equilibration. Do not expect active surgical bleeding. Responsible/ compliant pt with good caregiver who is able to monitor JP out and Uout. I  believe pt is stable for d/c with close outpt monitoring. Option for RBC transfusion declined by pt but will give 1 dose IV iron this am.  - GI. High likelihood to develop ileus. Patient with mild nausea and has Zofran as needed. Protonix as well at d/c. Advance diet cautiously.  Pt and husband aware of the small possibility of delayed bowel injury and the need for vigilance and assessing the JP drain for possible feculent material.  - Renal. Stents in place. Hematuria persists. History of urinary retention in the past (postpartum). Foley removed today, awaiting void but if unable can d/c home with foley. . Macrobid prophylaxis and give oxybutynin as needed as an antispasmodic at discharge. Patient will have follow-up with Dr.Eskridge as outpatient. -Postop prophylaxis. SCDs, ambulation.  Dispo. Likely home later today. Will follow patient up later this week in the office.  [name] 08/07/2020 11:52 AM

## 2020-08-07 NOTE — Plan of Care (Signed)
  Problem: Pain Managment: Goal: General experience of comfort will improve Outcome: Progressing   Problem: Safety: Goal: Ability to remain free from injury will improve Outcome: Progressing   Problem: Skin Integrity: Goal: Risk for impaired skin integrity will decrease Outcome: Progressing   

## 2020-08-07 NOTE — Progress Notes (Signed)
Foley d/c with 100 ml  Red color output. No clots noted. Will monitor pt's urge of urine.pt reamin on the chair call bell within reach.

## 2020-08-08 ENCOUNTER — Encounter (HOSPITAL_BASED_OUTPATIENT_CLINIC_OR_DEPARTMENT_OTHER): Payer: Self-pay | Admitting: Obstetrics

## 2020-08-08 LAB — SURGICAL PATHOLOGY

## 2020-08-08 NOTE — Op Note (Addendum)
[name], [name] MEDICAL RECORD NO: [id number] ACCOUNT NO: [id number] DATE OF BIRTH: [birth year] FACILITY: [facility] LOCATION: [facility] PHYSICIAN: Alexis Frock, MD  Operative Report   DATE OF PROCEDURE: 08/05/2020  PREOPERATIVE DIAGNOSES:  Large fibroid uterus with ureteral adhesions.  PROCEDURE:  Robot-assisted laparoscopic bilateral ureterolysis.  ESTIMATED BLOOD LOSS:  Nil.  COMPLICATIONS:  None.  SPECIMENS:  None.  ASSISTANT:  Festus Aloe, MD  FINDINGS: 1.  Distal ureteral displacement medially due to significant desmoplastic process around the inferior uterus and cervix area. 2.  Successful excellent mobilization of the ureters laterally away from the uterus and cervix.  INDICATIONS:  The patient is a 45 year old woman undergoing robot-assisted hysterectomy today under the care of the gynecology team led by Dr. Pamala Hayes.  She has a quite large fibroid uterus at baseline. Intraoperatively, Dr. Pamala Hayes astutely noted that  the ureters, especially the left appeared to be displaced and involved in some of the desmoplasia around the inferior aspect of her large fibroid uterus.  An intraoperative consultation was sought.  My colleague, Dr. Junious Hayes evaluated the patient,  successfully placed ureteral stents bilaterally as well as indocyanine green within the ureters for ureteral identification.  The team requested additional assistance in verification of ureteral anatomy and possible ureterolysis.  Fortunately, I was  between surgery today and had availability of assist and as such, trained our colleagues in effort to see the patient safely through the procedure today.  DESCRIPTION OF PROCEDURE:  The patient was verified, procedure being laparoscopic hysterectomy was confirmed.  Intravenous antibiotics were already on board and verified.  The patient was already asleep, prepped and draped, with the robot docked in OR 5  at the [facility].  Initial investigation of the  pelvis did reveal a very large fibroid uterus that had been excellently mobilized, the vast majority of the pedicles already been taken down. Under near infrared fluorescence light, it was  immediately noted the course of the ureters bilaterally and deep left ureter was somewhat adherent to the surface of the inferior aspect of the uterus and was already stented as well.  Circumferential embolization was performed at the area of the iliacs.   Ureter was marked with vessel loop, dissected proximally to distance of approximately 5 cm and then inferiorly to the ureterovesical junction, thus completely performing ureterolysis from the iliacs distal.  We then assisted on placing the ureters  laterally away from the inferior aspect of the uterus and cervix.  Similarly, the right ureter was encountered as it coursed over the iliac vessels, marked with a vessel loop, dissected proximally to distance of approximately 3 cm by the iliac crossing  it distally to the ureterovesical junction where similarly allowed to be displaced laterally.  At this point, Dr. Pamala Hayes once again proceeded with the extirpative portion of the surgery today with goal of hysterectomy.  After the extirpative portion of  procedure performed, the vessel loops were removed, dissected intact.  My colleague, Dr. Junious Hayes exchanged the open-ended stents for bilateral double-J stents to promote healing after ureterolysis as dictated in a separate operative note.   SHW D: 08/05/2020 4:29:05 pm T: 08/06/2020 4:15:00 am  JOB: [id number]/ [id number]

## 2020-08-08 NOTE — Discharge Summary (Signed)
[name] MRN: [id number] DOB/AGE: Jun 12, 1975 45 y.o.  Admit date: 08/05/2020 Discharge date: August 07, 2020  Admission Diagnoses: Uterine fibroid [D25.9] Status post laparoscopic assisted vaginal hysterectomy [Z90.710]  menorrhagia  Discharge Diagnoses: Uterine fibroid [D25.9] Status post laparoscopic assisted vaginal hysterectomy [Z90.710]        Active Problems:   Uterine fibroid   Status post laparoscopic assisted vaginal hysterectomy Stage IV endometriosis, menorrhagia, status post complicated ureterolysis, enterolysis, lysis of adhesions with ureteral stent placement, cystoscopy, proctoscopy.  Postoperative bleeding  Discharged Condition: stable  Hospital Course: Patient was admitted for planned uncomplicated robotic assisted total laparoscopic hysterectomy with bilateral salpingectomy.  Intraoperatively stage IV endometriosis was identified.  Patient needed enterolysis with proctoscopy by general surgery.  Ureterolysis with cystoscopy bilateral ureteral stent placement by 2 separate urologist.  Planned hysterectomy and bilateral salpingectomy was accomplished robotically with excessive operative time due to adhesions.  Patient had a JP drain placed due to significant denuding of the rectosigmoid serosa.  Patient remained stable through the case.  Patient had appropriate second dose antibiotics.  In the immediate postoperative period, more than expected sanguinous output was noted from the JP drain.  Over 3 postoperative hours approximately 350 cc had come from the JP drain.  Patient's vitals remained stable with good urine output.  Coag panel was negative.  Patient did receive IV transexamic acid and was crossed for 2 units of packed red cells.  Given the excessive operative time, lysis of adhesions and postop blood loss patient was admitted.  Over the 2 postoperative days her JP drain had marked reduction in output.  Her vitals remained stable.  She was kept on telemetry on her first  night but this was discontinued on postop day 1.  Creatinine remained normal and patient continued to have good urine output despite bloody urine.  By postop day #2 patient was asking to go home.  She was unable to void after the Foley catheter was removed and the Foley was replaced with plans for trial of void as an outpatient.  Of note patient does have a history of urinary retention after her vaginal delivery.  Patient had follow-up with urology set up.  Patient was given prophylactic Macrobid given her stents and Foley.  Patient's bowel function was improving slowly though she did not have flatus prior to discharge.  Patient was eating without emesis.  Consults: general surgery and urology  Treatments: surgery: Total robotic assisted laparoscopic hysterectomy with bilateral salpingectomy, ureteral stent placement, ureteral lysis, cystoscopy, proctoscopy, enterolysis.  Disposition: Discharge disposition: 01-Home or Self Care        Allergies as of 08/07/2020   No Known Allergies     Medication List    TAKE these medications   acetaminophen 325 MG tablet Commonly known as: TYLENOL Take 2 tablets (650 mg total) by mouth every 6 (six) hours.   bisacodyl 5 MG EC tablet Commonly known as: DULCOLAX Take 2 tablets (10 mg total) by mouth at bedtime.   ibuprofen 800 MG tablet Commonly known as: ADVIL Take 1 tablet (800 mg total) by mouth every 8 (eight) hours.   nitrofurantoin (macrocrystal-monohydrate) 100 MG capsule Commonly known as: MACROBID Take 1 capsule (100 mg total) by mouth at bedtime.   ondansetron 4 MG tablet Commonly known as: ZOFRAN Take 1 tablet (4 mg total) by mouth every 6 (six) hours as needed for nausea.   oxyCODONE 5 MG immediate release tablet Commonly known as: Oxy IR/ROXICODONE Take 1-2 tablets (5-10 mg total) by mouth every  4 (four) hours as needed for up to 5 days for moderate pain.   pantoprazole 40 MG tablet Commonly known as: PROTONIX Take 1  tablet (40 mg total) by mouth daily.       Follow-up Information    Call Festus Aloe, MD.   Specialty: Urology Contact information: [place] [address] [phone]               Signed: Ala Dach, MD 08/08/2020, 1:05 PM

## 2020-08-09 LAB — BPAM RBC
Blood Product Expiration Date: 202203162359
Blood Product Expiration Date: 202203162359
Unit Type and Rh: 6200
Unit Type and Rh: 6200

## 2020-08-09 LAB — TYPE AND SCREEN
ABO/RH(D): A POS
Antibody Screen: NEGATIVE
Unit division: 0
Unit division: 0

## 2023-12-30 ENCOUNTER — Encounter (INDEPENDENT_AMBULATORY_CARE_PROVIDER_SITE_OTHER): Payer: Self-pay

## 2024-01-08 ENCOUNTER — Ambulatory Visit (INDEPENDENT_AMBULATORY_CARE_PROVIDER_SITE_OTHER): Payer: Self-pay | Admitting: Physician Assistant

## 2024-01-08 VITALS — BP 138/92 | HR 67

## 2024-01-08 DIAGNOSIS — H9391 Unspecified disorder of right ear: Secondary | ICD-10-CM

## 2024-01-08 DIAGNOSIS — H9390 Unspecified disorder of ear, unspecified ear: Secondary | ICD-10-CM

## 2024-01-08 NOTE — Progress Notes (Signed)
 Dear Dr. Kip, Here is my assessment for our mutual patient, [name]. Thank you for allowing me the opportunity to care for your patient. Please do not hesitate to contact me should you have any other questions. Sincerely, Chyrl Cohen PA-C  Otolaryngology Clinic Note Referring provider: Dr. Kip HPI:  [name] is a 48 y.o. female kindly referred by Dr. Kip   The patient is a 48 year old female seen in our office for evaluation of ear lesion.  The patient notes that approximately 6 months ago she was told she had some abnormal bumps in her ear on the right.  She notes that this is not bothersome, no pain, no drainage, no hearing related issues.  No other bumps anywhere else.  She notes that she swims daily.  No history of the same.   Independent Review of Additional Tests or Records:     PMH/Meds/All/SocHx/FamHx/ROS:   Past Medical History:  Diagnosis Date   Acute blood loss anemia 04/08/2011   AMA (advanced maternal age) multigravida 35+    No pertinent past medical history    Uterine fibroid      Past Surgical History:  Procedure Laterality Date   CYSTOSCOPY/RETROGRADE/URETEROSCOPY  08/05/2020   Procedure: CYSTOSCOPY/RETROGRADE/URETEROSCOPY AND BILATERAL STENT PLACWMENT;  Surgeon: Kandyce Sor, MD;  Location: [facility];  Service: Gynecology;;  DR NIEVES   EPIDURAL BLOCK INJECTION     PROCTOSCOPY  08/05/2020   Procedure: PROCTOSCOPY;  Surgeon: Kandyce Sor, MD;  Location: [facility];  Service: Gynecology;;  DR DEBBY   ROBOTIC ASSISTED LAPAROSCOPIC HYSTERECTOMY AND SALPINGECTOMY Bilateral 08/05/2020   Procedure: XI ROBOTIC ASSISTED LAPAROSCOPIC HYSTERECTOMY AND SALPINGECTOMY;  Surgeon: Kandyce Sor, MD;  Location: [facility];  Service: Gynecology;  Laterality: Bilateral;    Family History  Problem Relation Age of Onset   Hypertension Mother      Social Connections: Not on file      Current Outpatient  Medications:    acetaminophen  (TYLENOL ) 325 MG tablet, Take 2 tablets (650 mg total) by mouth every 6 (six) hours., Disp: 60 tablet, Rfl: 1   bisacodyl  (DULCOLAX) 5 MG EC tablet, Take 2 tablets (10 mg total) by mouth at bedtime., Disp: 60 tablet, Rfl: 1   ibuprofen  (ADVIL ) 800 MG tablet, Take 1 tablet (800 mg total) by mouth every 8 (eight) hours., Disp: 60 tablet, Rfl: 1   nitrofurantoin , macrocrystal-monohydrate, (MACROBID ) 100 MG capsule, Take 1 capsule (100 mg total) by mouth at bedtime., Disp: 30 capsule, Rfl: 1   ondansetron  (ZOFRAN ) 4 MG tablet, Take 1 tablet (4 mg total) by mouth every 6 (six) hours as needed for nausea., Disp: 20 tablet, Rfl: 0   pantoprazole  (PROTONIX ) 40 MG tablet, Take 1 tablet (40 mg total) by mouth daily., Disp: 30 tablet, Rfl: 1   Physical Exam:   BP (!) 138/92   Pulse 67   SpO2 97%   Pertinent Findings  CN II-XII intact Bilateral EAC clear and TM intact with well pneumatized middle ear spaces- yellow/white lesions with a dark deeper component right concha, scattered yellow bumps noted distally to the larger collection, left concha with similar appearing although smaller lesions  Anterior rhinoscopy: Septum midline; bilateral inferior turbinates with no hypertrophy No lesions of oral cavity/oropharynx; dentition in normal limits No obvious neck masses/lymphadenopathy/thyromegaly No respiratory distress or stridor    Seprately Identifiable Procedures:  None  Impression & Plans:  [name] is a 48 y.o. female with the following   Ear lesions-  Right ear with a localized collection of whitish-yellow lesions.  It appears most consistent with xanthelasma although there is a deep darker component. She also has some scattered bumps outside the main cluster. There are some additional  smaller yellow bumps on the other ear. Although this is high on my differential I would like to consult one of our attending physicians about this and reach back out to her to  discuss this.    - f/u Phone call after physician consultation    Thank you for allowing me the opportunity to care for your patient. Please do not hesitate to contact me should you have any other questions.  Sincerely, Chyrl Cohen PA-C [facility] Phone: [phone] Fax: [phone]  01/08/2024, 2:30 PM

## 2024-01-23 ENCOUNTER — Telehealth (INDEPENDENT_AMBULATORY_CARE_PROVIDER_SITE_OTHER): Payer: Self-pay | Admitting: Physician Assistant

## 2024-01-23 NOTE — Telephone Encounter (Signed)
 Attempted to call the patient to provide further recommendations after speaking with Dr. Tobie.  I did leave a voicemail
